# Patient Record
Sex: Female | Born: 1995 | Race: White | Hispanic: No | Marital: Single | Smoking: Former smoker
Health system: Southern US, Community
[De-identification: ages and names within clinical notes are randomized; demographics above are authoritative.]

## PROBLEM LIST (undated history)

## (undated) DIAGNOSIS — F419 Anxiety disorder, unspecified: Secondary | ICD-10-CM

## (undated) DIAGNOSIS — R519 Headache, unspecified: Secondary | ICD-10-CM

## (undated) DIAGNOSIS — F329 Major depressive disorder, single episode, unspecified: Secondary | ICD-10-CM

## (undated) DIAGNOSIS — F32A Depression, unspecified: Secondary | ICD-10-CM

## (undated) DIAGNOSIS — R51 Headache: Secondary | ICD-10-CM

## (undated) DIAGNOSIS — M546 Pain in thoracic spine: Secondary | ICD-10-CM

## (undated) HISTORY — DX: Pain in thoracic spine: M54.6

## (undated) HISTORY — DX: Anxiety disorder, unspecified: F41.9

## (undated) HISTORY — DX: Headache: R51

## (undated) HISTORY — DX: Depression, unspecified: F32.A

## (undated) HISTORY — DX: Major depressive disorder, single episode, unspecified: F32.9

## (undated) HISTORY — PX: NO PAST SURGERIES: SHX2092

## (undated) HISTORY — DX: Headache, unspecified: R51.9

---

## 2015-09-22 NOTE — L&D Delivery Note (Signed)
Patient is 20 y.o. G1P0 4864w3d admitted for IOL 2/2 PROM  Delivery Note At 1:39 AM a viable female was delivered via Vaginal, Spontaneous Delivery (Presentation: ROA ).No complications.   APGAR: 9, 9;  weight pending  Placenta status:intact , .  Cord: no nuchal   Anesthesia:   Episiotomy: None Lacerations:  1st degree laceration  Suture Repair: none Est. Blood Loss (mL):  200   Mom to postpartum.  Baby to Couplet care / Skin to Skin.  Miquan Tandon Z Amariyana Heacox 05/10/2016, 2:35 AM  Upon arrival patient was complete and pushing. She pushed with good maternal effort to deliver a healthy baby. Baby delivered without difficulty, was noted to have good tone and place on maternal abdomen for oral suctioning, drying and stimulation. Delayed cord clamping performed. Placenta delivered intact with 3V cord. Vaginal canal and perineum was inspected and there was a 1st degree laceration, hemostasis was applied and laceration stopped bleeding ; hemostatic. Pitocin was started and uterus massaged until bleeding slowed. Counts of sharps, instruments, and lap pads were all correct.

## 2015-11-05 ENCOUNTER — Encounter: Payer: Self-pay | Admitting: Obstetrics

## 2015-11-05 ENCOUNTER — Ambulatory Visit (INDEPENDENT_AMBULATORY_CARE_PROVIDER_SITE_OTHER): Payer: Medicaid Other | Admitting: Obstetrics

## 2015-11-05 VITALS — BP 108/70 | HR 94 | Temp 98.2°F | Ht 65.0 in | Wt 167.0 lb

## 2015-11-05 DIAGNOSIS — Z3401 Encounter for supervision of normal first pregnancy, first trimester: Secondary | ICD-10-CM | POA: Diagnosis not present

## 2015-11-05 LAB — POCT URINALYSIS DIPSTICK
BILIRUBIN UA: NEGATIVE
Blood, UA: NEGATIVE
Glucose, UA: NEGATIVE
Ketones, UA: NEGATIVE
NITRITE UA: NEGATIVE
PH UA: 6
PROTEIN UA: NEGATIVE
Spec Grav, UA: 1.015
Urobilinogen, UA: NEGATIVE

## 2015-11-05 MED ORDER — VITAFOL ULTRA 29-0.6-0.4-200 MG PO CAPS: 1.0000 | ORAL_CAPSULE | Freq: Every day | ORAL | Status: DC

## 2015-11-05 NOTE — Progress Notes (Signed)
Subjective:    Rachel Campos is being seen today for her first obstetrical visit.  This is not a planned pregnancy. She is at 109w5d gestation. Her obstetrical history is significant for none. Relationship with FOB: significant other, living together. Patient does intend to breast feed. Pregnancy history fully reviewed.  The information documented in the HPI was reviewed and verified.  Menstrual History: OB History    Gravida Para Term Preterm AB TAB SAB Ectopic Multiple Living   1               Menarche age: 53  Patient's last menstrual period was 08/15/2015.    Past Medical History  Diagnosis Date  . Medical history non-contributory     Past Surgical History  Procedure Laterality Date  . No past surgeries       (Not in a hospital admission) No Known Allergies  Social History  Substance Use Topics  . Smoking status: Former Games developer  . Smokeless tobacco: Never Used  . Alcohol Use: No    Family History  Problem Relation Age of Onset  . Adopted: Yes  . Family history unknown: Yes     Review of Systems Constitutional: negative for weight loss Gastrointestinal: negative for vomiting Genitourinary:negative for genital lesions and vaginal discharge and dysuria Musculoskeletal:negative for back pain Behavioral/Psych: negative for abusive relationship, depression, illegal drug usage and tobacco use    Objective:    BP 108/70 mmHg  Pulse 94  Temp(Src) 98.2 F (36.8 C)  Ht  (1.651 m)  Wt 167 lb (75.751 kg)  BMI 27.79 kg/m2  LMP 08/15/2015 General Appearance:    Alert, cooperative, no distress, appears stated age  Head:    Normocephalic, without obvious abnormality, atraumatic  Eyes:    PERRL, conjunctiva/corneas clear, EOM's intact, fundi    benign, both eyes  Ears:    Normal TM's and external ear canals, both ears  Nose:   Nares normal, septum midline, mucosa normal, no drainage    or sinus tenderness  Throat:   Lips, mucosa, and tongue normal; teeth and gums  normal  Neck:   Supple, symmetrical, trachea midline, no adenopathy;    thyroid:  no enlargement/tenderness/nodules; no carotid   bruit or JVD  Back:     Symmetric, no curvature, ROM normal, no CVA tenderness  Lungs:     Clear to auscultation bilaterally, respirations unlabored  Chest Wall:    No tenderness or deformity   Heart:    Regular rate and rhythm, S1 and S2 normal, no murmur, rub   or gallop  Breast Exam:    No tenderness, masses, or nipple abnormality  Abdomen:     Soft, non-tender, bowel sounds active all four quadrants,    no masses, no organomegaly  Genitalia:    Normal female without lesion, discharge or tenderness  Extremities:   Extremities normal, atraumatic, no cyanosis or edema  Pulses:   2+ and symmetric all extremities  Skin:   Skin color, texture, turgor normal, no rashes or lesions  Lymph nodes:   Cervical, supraclavicular, and axillary nodes normal  Neurologic:   CNII-XII intact, normal strength, sensation and reflexes    throughout      Lab Review Urine pregnancy test Labs reviewed no Radiologic studies reviewed yes Assessment:    Pregnancy at [redacted]w[redacted]d weeks    Plan:      Prenatal vitamins.  Counseling provided regarding continued use of seat belts, cessation of alcohol consumption, smoking or use of illicit drugs; infection  precautions i.e., influenza/TDAP immunizations, toxoplasmosis,CMV, parvovirus, listeria and varicella; workplace safety, exercise during pregnancy; routine dental care, safe medications, sexual activity, hot tubs, saunas, pools, travel, caffeine use, fish and methlymercury, potential toxins, hair treatments, varicose veins Weight gain recommendations per IOM guidelines reviewed: underweight/BMI< 18.5--> gain 28 - 40 lbs; normal weight/BMI 18.5 - 24.9--> gain 25 - 35 lbs; overweight/BMI 25 - 29.9--> gain 15 - 25 lbs; obese/BMI >30->gain  11 - 20 lbs Problem list reviewed and updated. FIRST/CF mutation testing/NIPT/QUAD SCREEN/fragile  X/Ashkenazi Jewish population testing/Spinal muscular atrophy discussed: requested. Role of ultrasound in pregnancy discussed; fetal survey: requested. Amniocentesis discussed: not indicated. VBAC calculator score: VBAC consent form provided Meds ordered this encounter  Medications  . Prenatal Vit-Fe Fumarate-FA (PRENATAL VITAMINS PLUS PO)    Sig: Take by mouth.   Orders Placed This Encounter  Procedures  . Culture, OB Urine  . SureSwab, Vaginosis/Vaginitis Plus  . Obstetric panel  . HIV antibody  . Varicella zoster antibody, IgG  . VITAMIN D 25 Hydroxy (Vit-D Deficiency, Fractures)  . POCT urinalysis dipstick    Follow up in 4 weeks.

## 2015-11-06 LAB — OBSTETRIC PANEL
Antibody Screen: NEGATIVE
Basophils Absolute: 0 10*3/uL (ref 0.0–0.1)
Basophils Relative: 0 % (ref 0–1)
EOS PCT: 1 % (ref 0–5)
Eosinophils Absolute: 0.1 10*3/uL (ref 0.0–0.7)
HCT: 38.9 % (ref 36.0–46.0)
HEP B S AG: NEGATIVE
Hemoglobin: 12.9 g/dL (ref 12.0–15.0)
Lymphocytes Relative: 14 % (ref 12–46)
Lymphs Abs: 1.3 10*3/uL (ref 0.7–4.0)
MCH: 30.6 pg (ref 26.0–34.0)
MCHC: 33.2 g/dL (ref 30.0–36.0)
MCV: 92.4 fL (ref 78.0–100.0)
MPV: 9.2 fL (ref 8.6–12.4)
Monocytes Absolute: 0.7 10*3/uL (ref 0.1–1.0)
Monocytes Relative: 8 % (ref 3–12)
NEUTROS PCT: 77 % (ref 43–77)
Neutro Abs: 7 10*3/uL (ref 1.7–7.7)
PLATELETS: 279 10*3/uL (ref 150–400)
RBC: 4.21 MIL/uL (ref 3.87–5.11)
RDW: 13.1 % (ref 11.5–15.5)
RH TYPE: NEGATIVE
Rubella: 11.5 Index — ABNORMAL HIGH (ref <0.90)
WBC: 9.1 10*3/uL (ref 4.0–10.5)

## 2015-11-06 LAB — VARICELLA ZOSTER ANTIBODY, IGG: Varicella IgG: 327.3 Index — ABNORMAL HIGH (ref <135.00)

## 2015-11-06 LAB — HIV ANTIBODY (ROUTINE TESTING W REFLEX): HIV 1&2 Ab, 4th Generation: NONREACTIVE

## 2015-11-06 LAB — CULTURE, OB URINE

## 2015-11-06 LAB — VITAMIN D 25 HYDROXY (VIT D DEFICIENCY, FRACTURES): VIT D 25 HYDROXY: 24 ng/mL — AB (ref 30–100)

## 2015-11-09 LAB — SURESWAB, VAGINOSIS/VAGINITIS PLUS
Atopobium vaginae: 7.6 Log (cells/mL)
C. ALBICANS, DNA: DETECTED — AB
C. GLABRATA, DNA: NOT DETECTED
C. TRACHOMATIS RNA, TMA: NOT DETECTED
C. TROPICALIS, DNA: NOT DETECTED
C. parapsilosis, DNA: NOT DETECTED
LACTOBACILLUS SPECIES: NOT DETECTED Log (cells/mL)
MEGASPHAERA SPECIES: >8 Log (cells/mL)
N. gonorrhoeae RNA, TMA: NOT DETECTED
T. VAGINALIS RNA, QL TMA: NOT DETECTED

## 2015-11-10 ENCOUNTER — Other Ambulatory Visit: Payer: Self-pay | Admitting: Obstetrics

## 2015-11-10 DIAGNOSIS — B373 Candidiasis of vulva and vagina: Secondary | ICD-10-CM

## 2015-11-10 DIAGNOSIS — B3731 Acute candidiasis of vulva and vagina: Secondary | ICD-10-CM

## 2015-11-10 DIAGNOSIS — B9689 Other specified bacterial agents as the cause of diseases classified elsewhere: Secondary | ICD-10-CM

## 2015-11-10 DIAGNOSIS — N760 Acute vaginitis: Principal | ICD-10-CM

## 2015-11-10 MED ORDER — TERCONAZOLE 0.4 % VA CREA: 1.0000 | TOPICAL_CREAM | Freq: Every day | VAGINAL | Status: DC

## 2015-11-10 MED ORDER — TINIDAZOLE 500 MG PO TABS: 1000.0000 mg | ORAL_TABLET | Freq: Every day | ORAL | Status: DC

## 2015-12-03 ENCOUNTER — Ambulatory Visit (INDEPENDENT_AMBULATORY_CARE_PROVIDER_SITE_OTHER): Payer: Medicaid Other | Admitting: Certified Nurse Midwife

## 2015-12-03 VITALS — BP 122/73 | HR 70 | Temp 98.2°F | Wt 173.0 lb

## 2015-12-03 DIAGNOSIS — O26892 Other specified pregnancy related conditions, second trimester: Secondary | ICD-10-CM

## 2015-12-03 DIAGNOSIS — R519 Headache, unspecified: Secondary | ICD-10-CM

## 2015-12-03 DIAGNOSIS — R51 Headache: Secondary | ICD-10-CM

## 2015-12-03 DIAGNOSIS — Z3402 Encounter for supervision of normal first pregnancy, second trimester: Secondary | ICD-10-CM

## 2015-12-03 LAB — POCT URINALYSIS DIPSTICK
BILIRUBIN UA: NEGATIVE
Blood, UA: NEGATIVE
Glucose, UA: NEGATIVE
KETONES UA: NEGATIVE
LEUKOCYTES UA: NEGATIVE
NITRITE UA: NEGATIVE
PH UA: 5
Protein, UA: NEGATIVE
Spec Grav, UA: 1.01
Urobilinogen, UA: NEGATIVE

## 2015-12-03 MED ORDER — BUTALBITAL-APAP-CAFFEINE 50-325-40 MG PO TABS: 1.0000 | ORAL_TABLET | Freq: Four times a day (QID) | ORAL | Status: DC | PRN

## 2015-12-03 NOTE — Progress Notes (Signed)
  Subjective:    Eben BurowOlga Akel is a 20 y.o. female being seen today for her obstetrical visit. She is at 7961w5d gestation. Patient reports: headache, no bleeding, no contractions, no cramping and no leaking.    Problem List Items Addressed This Visit    None    Visit Diagnoses    Encounter for supervision of normal first pregnancy in second trimester    -  Primary    Relevant Orders    POCT urinalysis dipstick    AFP, Quad Screen    US OB Comp + 14 Wk    Headache in pregnancy, antepartum, second trimester        Relevant Medications    butalbital-acetaminophen-caffeine (FIORICET) 50-325-40 MG tablet      There are no active problems to display for this patient.   Objective:     BP 122/73 mmHg  Pulse 70  Temp(Src) 98.2 F (36.8 C)  Wt 173 lb (78.472 kg)  LMP 08/15/2015 Uterine Size: Below umbilicus   FHR by doppler: 158  Assessment:    Pregnancy @ 5661w5d  weeks Doing well   HA in pregnancy  Plan:    Problem list reviewed and updated. Labs reviewed.  Follow up in 4 weeks. FIRST/CF mutation testing/NIPT/QUAD SCREEN/fragile X/Ashkenazi Jewish population testing/Spinal muscular atrophy discussed: ordered. Role of ultrasound in pregnancy discussed; fetal survey: ordered. Amniocentesis discussed: not indicated. 50% of 15 minute visit spent on counseling and coordination of care.

## 2015-12-11 LAB — AFP, QUAD SCREEN
DIA Mom Value: 1.14
DIA VALUE (EIA): 194.82 pg/mL
DSR (BY AGE) 1 IN: 1148
DSR (SECOND TRIMESTER) 1 IN: 2369
GESTATIONAL AGE AFP: 15.7 wk
MSAFP Mom: 0.82
MSAFP: 23.4 ng/mL
MSHCG MOM: 1.05
MSHCG: 42515 m[IU]/mL
Maternal Age At EDD: 21 YEARS
Osb Risk: 10000
PDF: 0
TEST RESULTS AFP: NEGATIVE
WEIGHT: 173 [lb_av]
uE3 Mom: 0.78
uE3 Value: 0.56 ng/mL

## 2016-01-02 ENCOUNTER — Other Ambulatory Visit: Payer: Medicaid Other

## 2016-01-02 ENCOUNTER — Encounter: Payer: Medicaid Other | Admitting: Certified Nurse Midwife

## 2016-01-07 ENCOUNTER — Other Ambulatory Visit: Payer: Self-pay | Admitting: Certified Nurse Midwife

## 2016-01-16 ENCOUNTER — Ambulatory Visit (INDEPENDENT_AMBULATORY_CARE_PROVIDER_SITE_OTHER): Payer: Medicaid Other | Admitting: Certified Nurse Midwife

## 2016-01-16 ENCOUNTER — Ambulatory Visit (INDEPENDENT_AMBULATORY_CARE_PROVIDER_SITE_OTHER): Payer: Medicaid Other

## 2016-01-16 VITALS — BP 113/67 | HR 72 | Temp 98.6°F | Wt 183.0 lb

## 2016-01-16 DIAGNOSIS — F329 Major depressive disorder, single episode, unspecified: Secondary | ICD-10-CM

## 2016-01-16 DIAGNOSIS — O99342 Other mental disorders complicating pregnancy, second trimester: Secondary | ICD-10-CM

## 2016-01-16 DIAGNOSIS — Z3402 Encounter for supervision of normal first pregnancy, second trimester: Secondary | ICD-10-CM

## 2016-01-16 DIAGNOSIS — F32A Depression, unspecified: Secondary | ICD-10-CM

## 2016-01-16 DIAGNOSIS — Z36 Encounter for antenatal screening of mother: Secondary | ICD-10-CM

## 2016-01-16 MED ORDER — SERTRALINE HCL 50 MG PO TABS: 50.0000 mg | ORAL_TABLET | Freq: Every day | ORAL | Status: DC

## 2016-01-16 NOTE — Progress Notes (Signed)
Subjective:    Rachel Campos is a 20 y.o. female being seen today for her obstetrical visit. She is at 419w0d gestation. Patient reports: no bleeding, no contractions, no cramping, no leaking and depression symptoms: "does not feel like herself", denies homicidal/suicidal ideations, desires to try antidepressants to help her during this time, struggled with depression before pregnancy was not ever on any medicaitons . Fetal movement: normal.  Problem List Items Addressed This Visit    None     There are no active problems to display for this patient.  Objective:    LMP 08/15/2015 FHT: 145 BPM  Uterine Size: size equals dates     Assessment:    Pregnancy @ 109w0d    Depression Plan:    OBGCT: discussed. Signs and symptoms of preterm labor: discussed.  Labs, problem list reviewed and updated 2 hr GTT planned Follow up in 4 weeks.

## 2016-01-16 NOTE — Progress Notes (Signed)
Patient has concerns or questions today. Patient notices significant swelling in feet every other day

## 2016-02-13 ENCOUNTER — Other Ambulatory Visit: Payer: Medicaid Other

## 2016-02-13 ENCOUNTER — Ambulatory Visit (INDEPENDENT_AMBULATORY_CARE_PROVIDER_SITE_OTHER): Payer: Medicaid Other | Admitting: Certified Nurse Midwife

## 2016-02-13 VITALS — BP 118/77 | HR 76 | Wt 196.0 lb

## 2016-02-13 DIAGNOSIS — Z3402 Encounter for supervision of normal first pregnancy, second trimester: Secondary | ICD-10-CM

## 2016-02-13 LAB — POCT URINALYSIS DIPSTICK
BILIRUBIN UA: NEGATIVE
Glucose, UA: 50
Ketones, UA: NEGATIVE
Leukocytes, UA: NEGATIVE
NITRITE UA: NEGATIVE
PH UA: 7
Protein, UA: NEGATIVE
RBC UA: NEGATIVE
Spec Grav, UA: <=1.005
Urobilinogen, UA: NEGATIVE

## 2016-02-13 NOTE — Progress Notes (Signed)
Subjective:    Rachel BurowOlga Campos is a 20 y.o. female being seen today for her obstetrical visit. She is at 6729w0d gestation. Patient reports: no bleeding, no contractions, no cramping, no leaking and pelvic pain . Fetal movement: normal.  Problem List Items Addressed This Visit    None    Visit Diagnoses    Encounter for supervision of normal first pregnancy in second trimester    -  Primary    Relevant Orders    POCT urinalysis dipstick      There are no active problems to display for this patient.  Objective:    BP 118/77 mmHg  Pulse 76  Wt 196 lb (88.905 kg)  LMP 08/15/2015 FHT: 150 BPM  Uterine Size: size equals dates     Assessment:    Pregnancy @ 6629w0d    RH-  Pelvic pain of pregnancy  Plan:   Rx: abdominal maternity support belt  OBGCT: discussed and ordered. Antibody screen: Rhogam discussed. Signs and symptoms of preterm labor: discussed.  Labs, problem list reviewed and updated 2 hr GTT today Follow up in 2 weeks.

## 2016-02-13 NOTE — Progress Notes (Signed)
Pt has had some lower pelvic pressure.

## 2016-02-14 LAB — RPR: RPR: NONREACTIVE

## 2016-02-14 LAB — CBC
Hematocrit: 33.4 % — ABNORMAL LOW (ref 34.0–46.6)
Hemoglobin: 11.2 g/dL (ref 11.1–15.9)
MCH: 30.9 pg (ref 26.6–33.0)
MCHC: 33.5 g/dL (ref 31.5–35.7)
MCV: 92 fL (ref 79–97)
PLATELETS: 250 10*3/uL (ref 150–379)
RBC: 3.63 x10E6/uL — ABNORMAL LOW (ref 3.77–5.28)
RDW: 13.1 % (ref 12.3–15.4)
WBC: 12.2 10*3/uL — AB (ref 3.4–10.8)

## 2016-02-14 LAB — GLUCOSE TOLERANCE, 2 HOURS W/ 1HR
GLUCOSE, FASTING: 85 mg/dL (ref 65–91)
Glucose, 1 hour: 121 mg/dL (ref 65–179)
Glucose, 2 hour: 75 mg/dL (ref 65–152)

## 2016-02-14 LAB — HIV ANTIBODY (ROUTINE TESTING W REFLEX): HIV SCREEN 4TH GENERATION: NONREACTIVE

## 2016-02-26 ENCOUNTER — Encounter (HOSPITAL_COMMUNITY): Payer: Self-pay | Admitting: *Deleted

## 2016-02-26 ENCOUNTER — Inpatient Hospital Stay (HOSPITAL_COMMUNITY)
Admission: AD | Admit: 2016-02-26 | Discharge: 2016-02-26 | Disposition: A | Discharge status: Home / Self Care | Payer: Medicaid Other | Source: Ambulatory Visit | Attending: Obstetrics | Admitting: Obstetrics

## 2016-02-26 DIAGNOSIS — Z87891 Personal history of nicotine dependence: Secondary | ICD-10-CM | POA: Diagnosis not present

## 2016-02-26 DIAGNOSIS — R109 Unspecified abdominal pain: Secondary | ICD-10-CM | POA: Diagnosis present

## 2016-02-26 DIAGNOSIS — M549 Dorsalgia, unspecified: Secondary | ICD-10-CM | POA: Diagnosis present

## 2016-02-26 DIAGNOSIS — O26892 Other specified pregnancy related conditions, second trimester: Secondary | ICD-10-CM | POA: Insufficient documentation

## 2016-02-26 DIAGNOSIS — O99891 Other specified diseases and conditions complicating pregnancy: Secondary | ICD-10-CM

## 2016-02-26 DIAGNOSIS — O9989 Other specified diseases and conditions complicating pregnancy, childbirth and the puerperium: Secondary | ICD-10-CM

## 2016-02-26 DIAGNOSIS — Z3A27 27 weeks gestation of pregnancy: Secondary | ICD-10-CM | POA: Diagnosis not present

## 2016-02-26 LAB — URINALYSIS, ROUTINE W REFLEX MICROSCOPIC
Bilirubin Urine: NEGATIVE
Glucose, UA: NEGATIVE mg/dL
KETONES UR: NEGATIVE mg/dL
NITRITE: NEGATIVE
PH: 6 (ref 5.0–8.0)
Protein, ur: NEGATIVE mg/dL
Specific Gravity, Urine: <1.005 — ABNORMAL LOW (ref 1.005–1.030)

## 2016-02-26 LAB — URINE MICROSCOPIC-ADD ON

## 2016-02-26 LAB — FETAL FIBRONECTIN: Fetal Fibronectin: NEGATIVE

## 2016-02-26 MED ORDER — CYCLOBENZAPRINE HCL 5 MG PO TABS: 5.0000 mg | ORAL_TABLET | Freq: Three times a day (TID) | ORAL | Status: DC | PRN

## 2016-02-26 MED ORDER — CYCLOBENZAPRINE HCL 5 MG PO TABS: 10.0000 mg | ORAL_TABLET | Freq: Three times a day (TID) | ORAL | Status: DC | PRN

## 2016-02-26 MED ORDER — CYCLOBENZAPRINE HCL 10 MG PO TABS
10.0000 mg | ORAL_TABLET | Freq: Once | ORAL | Status: AC
  Administered 2016-02-26: 10 mg via ORAL
  Filled 2016-02-26: qty 1

## 2016-02-26 NOTE — Discharge Instructions (Signed)
Preterm Labor Information Preterm labor is when labor starts before you are [redacted] weeks pregnant. The normal length of pregnancy is 39 to 41 weeks.  CAUSES  The cause of preterm labor is not often known. The most common known cause is infection. RISK FACTORS  Having a history of preterm labor.  Having your water break before it should.  Having a placenta that covers the opening of the cervix.  Having a placenta that breaks away from the uterus.  Having a cervix that is too weak to hold the baby in the uterus.  Having too much fluid in the amniotic sac.  Taking drugs or smoking while pregnant.  Not gaining enough weight while pregnant.  Being younger than 4318 and older than 21 years old.  Having a low income.  Being African American. SYMPTOMS  Period-like cramps, belly (abdominal) pain, or back pain.  Contractions that are regular, as often as six in an hour. They may be mild or painful.  Contractions that start at the top of the belly. They then move to the lower belly and back.  Lower belly pressure that seems to get stronger.  Bleeding from the vagina.  Fluid leaking from the vagina. TREATMENT  Treatment depends on:  Your condition.  The condition of your baby.  How many weeks pregnant you are. Your doctor may have you:  Take medicine to stop contractions.  Stay in bed except to use the restroom (bed rest).  Stay in the hospital. WHAT SHOULD YOU DO IF YOU THINK YOU ARE IN PRETERM LABOR? Call your doctor right away. You need to go to the hospital right away.  HOW CAN YOU PREVENT PRETERM LABOR IN FUTURE PREGNANCIES?  Stop smoking, if you smoke.  Maintain healthy weight gain.  Do not take drugs or be around chemicals that are not needed.  Tell your doctor if you think you have an infection.  Tell your doctor if you had a preterm labor before.   This information is not intended to replace advice given to you by your health care provider. Make sure you  discuss any questions you have with your health care provider.   Document Released: 12/04/2008 Document Revised: 01/22/2015 Document Reviewed: 10/10/2012 Elsevier Interactive Patient Education 2016 Elsevier Inc[employer] Back Pain in Pregnancy Back pain during pregnancy is common. It happens in about half of all pregnancies. It is important for you and your baby that you remain active during your pregnancy.If you feel that back pain is not allowing you to remain active or sleep well, it is time to see your caregiver. Back pain may be caused by several factors related to changes during your pregnancy.Fortunately, unless you had trouble with your back before your pregnancy, the pain is likely to get better after you deliver. Low back pain usually occurs between the fifth and seventh months of pregnancy. It can, however, happen in the first couple months. Factors that increase the risk of back problems include:   Previous back problems.  Injury to your back.  Having twins or multiple births.  A chronic cough.  Stress.  Job-related repetitive motions.  Muscle or spinal disease in the back.  Family history of back problems, ruptured (herniated) discs, or osteoporosis.  Depression, anxiety, and panic attacks. CAUSES   When you are pregnant, your body produces a hormone called relaxin. This hormonemakes the ligaments connecting the low back and pubic bones more flexible. This flexibility allows the baby to be delivered more easily. When your ligaments are loose,  your muscles need to work harder to support your back. Soreness in your back can come from tired muscles. Soreness can also come from back tissues that are irritated since they are receiving less support.  As the baby grows, it puts pressure on the nerves and blood vessels in your pelvis. This can cause back pain.  As the baby grows and gets heavier during pregnancy, the uterus pushes the stomach muscles forward and changes your center of  gravity. This makes your back muscles work harder to maintain good posture. SYMPTOMS  Lumbar pain during pregnancy Lumbar pain during pregnancy usually occurs at or above the waist in the center of the back. There may be pain and numbness that radiates into your leg or foot. This is similar to low back pain experienced by non-pregnant women. It usually increases with sitting for long periods of time, standing, or repetitive lifting. Tenderness may also be present in the muscles along your upper back. Posterior pelvic pain during pregnancy Pain in the back of the pelvis is more common than lumbar pain in pregnancy. It is a deep pain felt in your side at the waistline, or across the tailbone (sacrum), or in both places. You may have pain on one or both sides. This pain can also go into the buttocks and backs of the upper thighs. Pubic and groin pain may also be present. The pain does not quickly resolve with rest, and morning stiffness may also be present. Pelvic pain during pregnancy can be brought on by most activities. A high level of fitness before and during pregnancy may or may not prevent this problem. Labor pain is usually 1 to 2 minutes apart, lasts for about 1 minute, and involves a bearing down feeling or pressure in your pelvis. However, if you are at term with the pregnancy, constant low back pain can be the beginning of early labor, and you should be aware of this. DIAGNOSIS  X-rays of the back should not be done during the first 12 to 14 weeks of the pregnancy and only when absolutely necessary during the rest of the pregnancy. MRIs do not give off radiation and are safe during pregnancy. MRIs also should only be done when absolutely necessary. HOME CARE INSTRUCTIONS  Exercise as directed by your caregiver. Exercise is the most effective way to prevent or manage back pain. If you have a back problem, it is especially important to avoid sports that require sudden body movements. Swimming and  walking are great activities.  Do not stand in one place for long periods of time.  Do not wear high heels.  Sit in chairs with good posture. Use a pillow on your lower back if necessary. Make sure your head rests over your shoulders and is not hanging forward.  Try sleeping on your side, preferably the left side, with a pillow or two between your legs. If you are sore after a night's rest, your bedmay betoo soft.Try placing a board between your mattress and box spring.  Listen to your body when lifting.If you are experiencing pain, ask for help or try bending yourknees more so you can use your leg muscles rather than your back muscles. Squat down when picking up something from the floor. Do not bend over.  Eat a healthy diet. Try to gain weight within your caregiver's recommendations.  Use heat or cold packs 3 to 4 times a day for 15 minutes to help with the pain.  Only take over-the-counter or prescription medicines for  pain, discomfort, or fever as directed by your caregiver. Sudden (acute) back pain  Use bed rest for only the most extreme, acute episodes of back pain. Prolonged bed rest over 48 hours will aggravate your condition.  Ice is very effective for acute conditions.  Put ice in a plastic bag.  Place a towel between your skin and the bag.  Leave the ice on for 10 to 20 minutes every 2 hours, or as needed.  Using heat packs for 30 minutes prior to activities is also helpful. Continued back pain See your caregiver if you have continued problems. Your caregiver can help or refer you for appropriate physical therapy. With conditioning, most back problems can be avoided. Sometimes, a more serious issue may be the cause of back pain. You should be seen right away if new problems seem to be developing. Your caregiver may recommend:  A maternity girdle.  An elastic sling.  A back brace.  A massage therapist or acupuncture. SEEK MEDICAL CARE IF:   You are not able to  do most of your daily activities, even when taking the pain medicine you were given.  You need a referral to a physical therapist or chiropractor.  You want to try acupuncture. SEEK IMMEDIATE MEDICAL CARE IF:  You develop numbness, tingling, weakness, or problems with the use of your arms or legs.  You develop severe back pain that is no longer relieved with medicines.  You have a sudden change in bowel or bladder control.  You have increasing pain in other areas of the body.  You develop shortness of breath, dizziness, or fainting.  You develop nausea, vomiting, or sweating.  You have back pain which is similar to labor pains.  You have back pain along with your water breaking or vaginal bleeding.  You have back pain or numbness that travels down your leg.  Your back pain developed after you fell.  You develop pain on one side of your back. You may have a kidney stone.  You see blood in your urine. You may have a bladder infection or kidney stone.  You have back pain with blisters. You may have shingles. Back pain is fairly common during pregnancy but should not be accepted as just part of the process. Back pain should always be treated as soon as possible. This will make your pregnancy as pleasant as possible.   This information is not intended to replace advice given to you by your health care provider. Make sure you discuss any questions you have with your health care provider.   Document Released: 12/16/2005 Document Revised: 11/30/2011 Document Reviewed: 01/27/2011 Elsevier Interactive Patient Education [employer]

## 2016-02-26 NOTE — MAU Provider Note (Signed)
History     CSN: [id number]  Arrival date and time: 02/26/16 1512    First Provider Initiated Contact with Patient 02/26/16 1622      Chief Complaint  Patient presents with  . Abdominal Pain  . Back Pain   HPI Rachel Campos is a 20 y.o. G1P0 at 3142w6d who presents with abdominal & back pain. Symptoms began this morning. Reports intermittent pain in upper abdomen that wraps around to her mid back and lower abdomen. Occurs every 20 minutes. Rates pain 8/10. Has not treated. Denies LOF or vaginal bleeding. Positive fetal movement.   OB History    Gravida Para Term Preterm AB TAB SAB Ectopic Multiple Living   1               Past Medical History  Diagnosis Date  . Medical history non-contributory     Past Surgical History  Procedure Laterality Date  . No past surgeries      Family History  Problem Relation Age of Onset  . Adopted: Yes  . Family history unknown: Yes    Social History  Substance Use Topics  . Smoking status: Former Games developermoker  . Smokeless tobacco: Never Used  . Alcohol Use: No    Allergies: No Known Allergies  Prescriptions prior to admission  Medication Sig Dispense Refill Last Dose  . calcium carbonate (TUMS - DOSED IN MG ELEMENTAL CALCIUM) 500 MG chewable tablet Chew 1 tablet by mouth 3 (three) times daily as needed for indigestion or heartburn.   02/25/2016 at Unknown time  . Prenat-Fe Poly-Methfol-FA-DHA (VITAFOL ULTRA) 29-0.6-0.4-200 MG CAPS Take 1 capsule by mouth daily before breakfast. 90 capsule 3 02/25/2016 at Unknown time  . butalbital-acetaminophen-caffeine (FIORICET) 50-325-40 MG tablet Take 1-2 tablets by mouth every 6 (six) hours as needed for headache. 45 tablet 4 PRN  . sertraline (ZOLOFT) 50 MG tablet Take 1 tablet (50 mg total) by mouth daily. (Patient not taking: Reported on 02/26/2016) 30 tablet 2 Not Taking at Unknown time  . terconazole (TERAZOL 7) 0.4 % vaginal cream Place 1 applicator vaginally at bedtime. (Patient not taking: Reported on  02/26/2016) 45 g 0 Completed Course at Unknown time    Review of Systems  Constitutional: Negative.   Gastrointestinal: Positive for abdominal pain. Negative for nausea, vomiting, diarrhea and constipation.  Genitourinary: Negative.   Musculoskeletal: Positive for back pain.   Physical Exam   Blood pressure 127/67, pulse 77, resp. rate 16, last menstrual period 08/15/2015.  Physical Exam  Nursing note and vitals reviewed. Constitutional: She is oriented to person, place, and time. She appears well-developed and well-nourished. No distress.  HENT:  Head: Normocephalic and atraumatic.  Eyes: Conjunctivae are normal. Right eye exhibits no discharge. Left eye exhibits no discharge. No scleral icterus.  Neck: Normal range of motion.  Cardiovascular: Normal rate, regular rhythm and normal heart sounds.   No murmur heard. Respiratory: Effort normal and breath sounds normal. No respiratory distress. She has no wheezes.  GI: Soft. There is no tenderness. There is no CVA tenderness.  Neurological: She is alert and oriented to person, place, and time.  Skin: Skin is warm and dry. She is not diaphoretic.  Psychiatric: She has a normal mood and affect. Her behavior is normal. Judgment and thought content normal.   Dilation: Closed Effacement (%): Thick Exam by:: E Kitara Hebb NP  Fetal Tracing:  Baseline: 135 Variability: moderate Accelerations: 15x15 Decelerations: none  Toco: none    MAU Course  Procedures Results for orders  placed or performed during the hospital encounter of 02/26/16 (from the past 24 hour(s))  Urinalysis, Routine w reflex microscopic (not at [facility])     Status: Abnormal   Collection Time: 02/26/16  4:04 PM  Result Value Ref Range   Color, Urine YELLOW YELLOW   APPearance HAZY (A) CLEAR   Specific Gravity, Urine <1.005 (L) 1.005 - 1.030   pH 6.0 5.0 - 8.0   Glucose, UA NEGATIVE NEGATIVE mg/dL   Hgb urine dipstick TRACE (A) NEGATIVE   Bilirubin Urine NEGATIVE  NEGATIVE   Ketones, ur NEGATIVE NEGATIVE mg/dL   Protein, ur NEGATIVE NEGATIVE mg/dL   Nitrite NEGATIVE NEGATIVE   Leukocytes, UA LARGE (A) NEGATIVE  Urine microscopic-add on     Status: Abnormal   Collection Time: 02/26/16  4:04 PM  Result Value Ref Range   Squamous Epithelial / LPF 0-5 (A) NONE SEEN   WBC, UA 6-30 0 - 5 WBC/hpf   RBC / HPF 0-5 0 - 5 RBC/hpf   Bacteria, UA FEW (A) NONE SEEN  Fetal fibronectin     Status: None   Collection Time: 02/26/16  4:30 PM  Result Value Ref Range   Fetal Fibronectin NEGATIVE NEGATIVE    MDM Reactive tracing, no ctx on monitor FFN negative Flexeril 10 mg PO -- pt reports improvement in pain from 8>3 S/w Dr. Clearance Coots -- ok to discharge home. Will send home with some flexeril. Pt has f/u with [facility] tomorrow.  Assessment and Plan  A: 1. Back pain affecting pregnancy in second trimester     P: Discharge home Rx small amt of flexeril Keep f/u with [facility] tomorrow Discussed reasons to return to MAU  Judeth Horn 02/26/2016, 4:21 PM

## 2016-02-26 NOTE — MAU Note (Signed)
C/o rib pain today at work and pt's employee sent her to MAU; c/o back pain "for a while";

## 2016-02-27 ENCOUNTER — Ambulatory Visit (INDEPENDENT_AMBULATORY_CARE_PROVIDER_SITE_OTHER): Payer: Medicaid Other | Admitting: Certified Nurse Midwife

## 2016-02-27 VITALS — BP 113/75 | HR 97 | Wt 196.0 lb

## 2016-02-27 DIAGNOSIS — O360130 Maternal care for anti-D [Rh] antibodies, third trimester, not applicable or unspecified: Secondary | ICD-10-CM

## 2016-02-27 DIAGNOSIS — Z3483 Encounter for supervision of other normal pregnancy, third trimester: Secondary | ICD-10-CM

## 2016-02-27 LAB — POCT URINALYSIS DIPSTICK
Bilirubin, UA: NEGATIVE
Glucose, UA: NEGATIVE
Ketones, UA: NEGATIVE
LEUKOCYTES UA: NEGATIVE
NITRITE UA: NEGATIVE
PH UA: 5
Spec Grav, UA: 1.025
UROBILINOGEN UA: NEGATIVE

## 2016-02-27 MED ORDER — RHO D IMMUNE GLOBULIN 1500 UNIT/2ML IJ SOSY
300.0000 ug | PREFILLED_SYRINGE | Freq: Once | INTRAMUSCULAR | Status: AC
  Administered 2016-02-27: 300 ug via INTRAMUSCULAR

## 2016-02-29 LAB — URINE CULTURE, OB REFLEX: ORGANISM ID, BACTERIA: NO GROWTH

## 2016-02-29 LAB — CULTURE, OB URINE

## 2016-02-29 NOTE — Progress Notes (Signed)
Subjective:    Rachel BurowOlga Campos is a 20 y.o. female being seen today for her obstetrical visit. She is at 1176w0d gestation. Patient reports no complaints. Fetal movement: normal.  Problem List Items Addressed This Visit    None    Visit Diagnoses    Encounter for supervision of other normal pregnancy in third trimester    -  Primary    Relevant Medications    rho (d) immune globulin (RHIG/RHOPHYLAC) injection 300 mcg (Completed)    Other Relevant Orders    POCT urinalysis dipstick (Completed)    Culture, OB Urine (Completed)    Rh negative state in antepartum period, third trimester, not applicable or unspecified fetus        Relevant Medications    rho (d) immune globulin (RHIG/RHOPHYLAC) injection 300 mcg (Completed)      There are no active problems to display for this patient.  Objective:    BP 113/75 mmHg  Pulse 97  Wt 196 lb (88.905 kg)  LMP 08/15/2015 FHT:  145 BPM  Uterine Size: size equals dates  Presentation: unsure     Assessment:    Pregnancy @ 3967w2d weeks   RH- status: Rhogam given  Plan:   2 hour OGTT today   labs reviewed, problem list updated Consent signed. GBS planning TDAP offered  Rhogam given for RH negative Pediatrician: discussed. Infant feeding: plans to breastfeed. Maternity leave: discussed. Cigarette smoking: never smoked. Orders Placed This Encounter  Procedures  . Culture, OB Urine  . Result  . POCT urinalysis dipstick   Meds ordered this encounter  Medications  . rho (d) immune globulin (RHIG/RHOPHYLAC) injection 300 mcg    Sig:    Follow up in 2 Weeks.

## 2016-03-13 ENCOUNTER — Ambulatory Visit (INDEPENDENT_AMBULATORY_CARE_PROVIDER_SITE_OTHER): Payer: Medicaid Other | Admitting: Certified Nurse Midwife

## 2016-03-13 VITALS — BP 132/77 | HR 73 | Temp 98.4°F | Wt 211.0 lb

## 2016-03-13 DIAGNOSIS — Z3403 Encounter for supervision of normal first pregnancy, third trimester: Secondary | ICD-10-CM

## 2016-03-13 LAB — POCT URINALYSIS DIPSTICK
BILIRUBIN UA: NEGATIVE
Blood, UA: NEGATIVE
Glucose, UA: NEGATIVE
KETONES UA: NEGATIVE
Nitrite, UA: NEGATIVE
Spec Grav, UA: <=1.005
Urobilinogen, UA: NEGATIVE
pH, UA: 6

## 2016-03-13 NOTE — Progress Notes (Signed)
Subjective:    Rachel BurowOlga Campos is a 20 y.o. female being seen today for her obstetrical visit. She is at 5255w1d gestation. Patient reports no complaints. Fetal movement: normal.  Problem List Items Addressed This Visit    None    Visit Diagnoses    Encounter for supervision of normal first pregnancy in third trimester    -  Primary    Relevant Orders    POCT urinalysis dipstick (Completed)      There are no active problems to display for this patient.  Objective:    BP 132/77 mmHg  Pulse 73  Temp(Src) 98.4 F (36.9 C)  Wt 211 lb (95.709 kg)  LMP 08/15/2015 FHT:  145 BPM  Uterine Size: size equals dates  Presentation: cephalic     Assessment:    Pregnancy @ 4555w1d weeks   Doing well  Plan:     labs reviewed, problem list updated Consent signed. GBS planning TDAP offered  Rhogam given for RH negative Pediatrician: discussed. Infant feeding: plans to breastfeed. Maternity leave: discussed. Cigarette smoking: never smoked. Orders Placed This Encounter  Procedures  . POCT urinalysis dipstick   No orders of the defined types were placed in this encounter.   Follow up in 2 Weeks.

## 2016-03-27 ENCOUNTER — Ambulatory Visit (INDEPENDENT_AMBULATORY_CARE_PROVIDER_SITE_OTHER): Payer: Medicaid Other | Admitting: Certified Nurse Midwife

## 2016-03-27 VITALS — BP 109/72 | HR 69 | Temp 98.4°F | Wt 206.2 lb

## 2016-03-27 DIAGNOSIS — Z3403 Encounter for supervision of normal first pregnancy, third trimester: Secondary | ICD-10-CM | POA: Insufficient documentation

## 2016-03-27 NOTE — Assessment & Plan Note (Signed)
  Clinic  [facility]  Dating  LMP Blood type: B/NEG/-- (02/14 1346)   Genetic Screen 1 Screen:    AFP: 12/03/15: normal    Quad:     NIPS: Antibody:NEG (02/14 1346)  Anatomic US  01/16/16: normal Rubella: 11.50 (02/14 1346)  GTT Early:               Third trimester: 02/12/26: normal RPR: Non Reactive (05/25 1110)   Flu vaccine  HBsAg: NEGATIVE (02/14 1346)   TDaP vaccine                                               Rhogam: 02/29/16 HIV: Non Reactive (05/25 1110)   Baby Food            Breast                                    GBS: (For PCN allergy, check sensitivities)  Contraception  Mirena IUD Pap: N/A  Circumcision  [facility]   Pediatrician    Support Person

## 2016-03-27 NOTE — Progress Notes (Signed)
Pt states she is having increase in pelvic pain.  

## 2016-03-27 NOTE — Progress Notes (Signed)
Subjective:    Rachel BurowOlga Campos is a 20 y.o. female being seen today for her obstetrical visit. She is at 3913w1d gestation. Patient reports no complaints. Fetal movement: normal.  Problem List Items Addressed This Visit      Other   Supervision of normal first pregnancy in third trimester - Primary     Clinic  [facility]  Dating  LMP Blood type: B/NEG/-- (02/14 1346)   Genetic Screen 1 Screen:    AFP: 12/03/15: normal    Quad:     NIPS: Antibody:NEG (02/14 1346)  Anatomic US  01/16/16: normal Rubella: 11.50 (02/14 1346)  GTT Early:               Third trimester: 02/12/26: normal RPR: Non Reactive (05/25 1110)   Flu vaccine  HBsAg: NEGATIVE (02/14 1346)   TDaP vaccine                                               Rhogam: 02/29/16 HIV: Non Reactive (05/25 1110)   Baby Food            Breast                                    GBS: (For PCN allergy, check sensitivities)  Contraception  Mirena IUD Pap: N/A  Circumcision  [facility]   Pediatrician    Support Person            Patient Active Problem List   Diagnosis Date Noted  . Supervision of normal first pregnancy in third trimester 03/27/2016   Objective:    BP 109/72 mmHg  Pulse 69  Temp(Src) 98.4 F (36.9 C)  Wt 206 lb 3.2 oz (93.532 kg)  LMP 08/15/2015 FHT:  130 BPM  Uterine Size: size equals dates  Presentation: cephalic     Assessment:    Pregnancy @ 1013w1d weeks   Doing well  Plan:     labs reviewed, problem list updated Consent signed. GBS planning TDAP offered  Rhogam given for RH negative Pediatrician: discussed. Infant feeding: plans to breastfeed. Maternity leave: discussed. Cigarette smoking: never smoked. No orders of the defined types were placed in this encounter.   No orders of the defined types were placed in this encounter.   Follow up in 2 Weeks.

## 2016-03-28 ENCOUNTER — Encounter: Payer: Self-pay | Admitting: Certified Nurse Midwife

## 2016-04-10 ENCOUNTER — Ambulatory Visit (INDEPENDENT_AMBULATORY_CARE_PROVIDER_SITE_OTHER): Payer: Medicaid Other | Admitting: Certified Nurse Midwife

## 2016-04-10 VITALS — BP 118/69 | HR 89 | Temp 97.7°F | Wt 209.0 lb

## 2016-04-10 DIAGNOSIS — Z3403 Encounter for supervision of normal first pregnancy, third trimester: Secondary | ICD-10-CM

## 2016-04-10 NOTE — Assessment & Plan Note (Signed)
  Clinic  [facility]  Dating  LMP: 05/21/16 Blood type: B/NEG/-- (02/14 1346)   Genetic Screen 1 Screen:    AFP: 12/03/15: normal     Quad:     NIPS: Antibody:NEG (02/14 1346)  Anatomic US  18 wks: normal Rubella: 11.50 (02/14 1346)  GTT Early:               Third trimester: Normal  RPR: Non Reactive (05/25 1110)   Flu vaccine  HBsAg: NEGATIVE (02/14 1346)   TDaP vaccine                                               Rhogam: 02/29/16 HIV: Non Reactive (05/25 1110)   Baby Food   Breast                                            GBS: (For PCN allergy, check sensitivities)  Contraception   IUD Pap:N/A  Circumcision   [facility]   Pediatrician   [facility] [place]   Support Person   FOB: Deneen HartsKelvin Betha

## 2016-04-10 NOTE — Progress Notes (Signed)
Subjective:    Rachel Campos is a 20 y.o. female being seen today for her obstetrical visit. She is at 5635w1d gestation. Patient reports no complaints. Fetal movement: normal.  Is in the process of moving into an apartment.  Has had housing issues d/t rental house roof caving in.    Problem List Items Addressed This Visit      Other   Supervision of normal first pregnancy in third trimester - Primary     Clinic  [facility]  Dating  LMP: 05/21/16 Blood type: B/NEG/-- (02/14 1346)   Genetic Screen 1 Screen:    AFP: 12/03/15: normal     Quad:     NIPS: Antibody:NEG (02/14 1346)  Anatomic US  18 wks: normal Rubella: 11.50 (02/14 1346)  GTT Early:               Third trimester: Normal  RPR: Non Reactive (05/25 1110)   Flu vaccine  HBsAg: NEGATIVE (02/14 1346)   TDaP vaccine                                               Rhogam: 02/29/16 HIV: Non Reactive (05/25 1110)   Baby Food   Breast                                            GBS: (For PCN allergy, check sensitivities)  Contraception   IUD Pap:N/A  Circumcision   [facility]   Pediatrician   [facility] [place]   Support Person   FOB: Deneen HartsKelvin Betha            Patient Active Problem List   Diagnosis Date Noted  . Supervision of normal first pregnancy in third trimester 03/27/2016   Objective:    BP 118/69 mmHg  Pulse 89  Temp(Src) 97.7 F (36.5 C)  Wt 209 lb (94.802 kg)  LMP 08/15/2015 FHT:  153 BPM  Uterine Size: size equals dates  Presentation: cephalic     Assessment:    Pregnancy @ 2135w1d weeks   Plan:     labs reviewed, problem list updated Consent signed. GBS planning TDAP offered  Rhogam given for RH negative Pediatrician: discussed. Infant feeding: plans to breastfeed. Maternity leave: discussed. Cigarette smoking: never smoked. No orders of the defined types were placed in this encounter.   No orders of the defined types were placed in this encounter.   Follow up in 1 Week with GBS.

## 2016-04-17 ENCOUNTER — Ambulatory Visit (INDEPENDENT_AMBULATORY_CARE_PROVIDER_SITE_OTHER): Payer: Medicaid Other | Admitting: Certified Nurse Midwife

## 2016-04-17 VITALS — BP 120/75 | HR 77 | Temp 98.4°F | Wt 212.4 lb

## 2016-04-17 DIAGNOSIS — Z3403 Encounter for supervision of normal first pregnancy, third trimester: Secondary | ICD-10-CM

## 2016-04-17 LAB — POCT URINALYSIS DIPSTICK
Bilirubin, UA: NEGATIVE
Blood, UA: NEGATIVE
GLUCOSE UA: NEGATIVE
Ketones, UA: NEGATIVE
NITRITE UA: NEGATIVE
Protein, UA: NEGATIVE
Spec Grav, UA: <=1.005
UROBILINOGEN UA: NEGATIVE
pH, UA: 6

## 2016-04-17 NOTE — Progress Notes (Signed)
Subjective:    Rachel Campos is a 20 y.o. female being seen today for her obstetrical visit. She is at [redacted]w[redacted]d gestation. Patient reports no complaints and overnight was feeling ill, recently moved, has swollen ankles/feet today, VSS. Fetal movement: normal.  Problem List Items Addressed This Visit    None    Visit Diagnoses    Encounter for supervision of normal first pregnancy in third trimester    -  Primary   Relevant Orders   POCT urinalysis dipstick (Completed)   Strep Gp B NAA   NuSwab VG+, Candida 6sp     Patient Active Problem List   Diagnosis Date Noted  . Supervision of normal first pregnancy in third trimester 03/27/2016   Objective:    BP 120/75   Pulse 77   Temp 98.4 F (36.9 C)   Wt 212 lb 6.4 oz (96.3 kg)   LMP 08/15/2015   BMI 35.35 kg/m  FHT:  135 BPM  Uterine Size: size equals dates  Presentation: cephalic   Cervix: 1cm, posterior, soft, -3 station  Assessment:    Pregnancy @ [redacted]w[redacted]d weeks   Plan:    Out of work note for today   labs reviewed, problem list updated Consent signed. GBS sent TDAP offered  Rhogam given for RH negative Pediatrician: discussed. Infant feeding: plans to breastfeed. Maternity leave: discussed. Cigarette smoking: never smoked. Orders Placed This Encounter  Procedures  . Strep Gp B NAA  . POCT urinalysis dipstick   No orders of the defined types were placed in this encounter.  Follow up in 1 Week.

## 2016-04-19 LAB — STREP GP B NAA: Strep Gp B NAA: NEGATIVE

## 2016-04-21 ENCOUNTER — Other Ambulatory Visit: Payer: Self-pay | Admitting: Certified Nurse Midwife

## 2016-04-21 ENCOUNTER — Ambulatory Visit (INDEPENDENT_AMBULATORY_CARE_PROVIDER_SITE_OTHER): Payer: Medicaid Other | Admitting: Obstetrics & Gynecology

## 2016-04-21 VITALS — BP 111/73 | HR 80 | Temp 98.7°F | Wt 214.4 lb

## 2016-04-21 DIAGNOSIS — N760 Acute vaginitis: Principal | ICD-10-CM

## 2016-04-21 DIAGNOSIS — Z3403 Encounter for supervision of normal first pregnancy, third trimester: Secondary | ICD-10-CM

## 2016-04-21 DIAGNOSIS — B9689 Other specified bacterial agents as the cause of diseases classified elsewhere: Secondary | ICD-10-CM

## 2016-04-21 LAB — NUSWAB VG+, CANDIDA 6SP
CANDIDA ALBICANS, NAA: NEGATIVE
CANDIDA GLABRATA, NAA: NEGATIVE
CANDIDA LUSITANIAE, NAA: NEGATIVE
CANDIDA TROPICALIS, NAA: NEGATIVE
Candida krusei, NAA: NEGATIVE
Candida parapsilosis, NAA: NEGATIVE
Chlamydia trachomatis, NAA: NEGATIVE
Megasphaera 1: HIGH Score — AB
NEISSERIA GONORRHOEAE, NAA: NEGATIVE
Trich vag by NAA: NEGATIVE

## 2016-04-21 LAB — POCT URINALYSIS DIPSTICK
BILIRUBIN UA: NEGATIVE
GLUCOSE UA: 100
KETONES UA: NEGATIVE
NITRITE UA: NEGATIVE
Protein, UA: NEGATIVE
RBC UA: NEGATIVE
Spec Grav, UA: 1.01
Urobilinogen, UA: 0.2
pH, UA: 7

## 2016-04-21 LAB — OB RESULTS CONSOLE GBS: STREP GROUP B AG: NEGATIVE

## 2016-04-21 MED ORDER — METRONIDAZOLE 500 MG PO TABS: 500.0000 mg | ORAL_TABLET | Freq: Two times a day (BID) | ORAL | 0 refills | Status: DC

## 2016-04-21 NOTE — Patient Instructions (Addendum)
Return to clinic for any scheduled appointments or obstetric concerns, or go to [facility] for evaluation  Tdap Vaccine (Tetanus, Diphtheria and Pertussis): What You Need to Know 1. Why get vaccinated? Tetanus, diphtheria and pertussis are very serious diseases. Tdap vaccine can protect us from these diseases. And, Tdap vaccine given to pregnant women can protect newborn babies against pertussis. TETANUS (Lockjaw) is rare in the [place] today. It causes painful muscle tightening and stiffness, usually all over the body.  It can lead to tightening of muscles in the head and neck so you can't open your mouth, swallow, or sometimes even breathe. Tetanus kills about 1 out of 10 people who are infected even after receiving the best medical care. DIPHTHERIA is also rare in the [place] today. It can cause a thick coating to form in the back of the throat.  It can lead to breathing problems, heart failure, paralysis, and death. PERTUSSIS (Whooping Cough) causes severe coughing spells, which can cause difficulty breathing, vomiting and disturbed sleep.  It can also lead to weight loss, incontinence, and rib fractures. Up to 2 in 100 adolescents and 5 in 100 adults with pertussis are hospitalized or have complications, which could include pneumonia or death. These diseases are caused by bacteria. Diphtheria and pertussis are spread from person to person through secretions from coughing or sneezing. Tetanus enters the body through cuts, scratches, or wounds. Before vaccines, as many as 200,000 cases of diphtheria, 200,000 cases of pertussis, and hundreds of cases of tetanus, were reported in the [place] each year. Since vaccination began, reports of cases for tetanus and diphtheria have dropped by about 99% and for pertussis by about 80%. 2. Tdap vaccine Tdap vaccine can protect adolescents and adults from tetanus, diphtheria, and pertussis. One dose of Tdap is routinely given at age 11 or 12.  People who did not get Tdap at that age should get it as soon as possible. Tdap is especially important for healthcare professionals and anyone having close contact with a baby younger than 12 months. Pregnant women should get a dose of Tdap during every pregnancy, to protect the newborn from pertussis. Infants are most at risk for severe, life-threatening complications from pertussis. Another vaccine, called Td, protects against tetanus and diphtheria, but not pertussis. A Td booster should be given every 10 years. Tdap may be given as one of these boosters if you have never gotten Tdap before. Tdap may also be given after a severe cut or burn to prevent tetanus infection. Your doctor or the person giving you the vaccine can give you more information. Tdap may safely be given at the same time as other vaccines. 3. Some people should not get this vaccine  A person who has ever had a life-threatening allergic reaction after a previous dose of any diphtheria, tetanus or pertussis containing vaccine, OR has a severe allergy to any part of this vaccine, should not get Tdap vaccine. Tell the person giving the vaccine about any severe allergies.  Anyone who had coma or long repeated seizures within 7 days after a childhood dose of DTP or DTaP, or a previous dose of Tdap, should not get Tdap, unless a cause other than the vaccine was found. They can still get Td.  Talk to your doctor if you:  have seizures or another nervous system problem,  had severe pain or swelling after any vaccine containing diphtheria, tetanus or pertussis,  ever had a condition called Guillain-Barr Syndrome (GBS),  aren't feeling   well on the day the shot is scheduled. 4. Risks With any medicine, including vaccines, there is a chance of side effects. These are usually mild and go away on their own. Serious reactions are also possible but are rare. Most people who get Tdap vaccine do not have any problems with it. Mild  problems following Tdap (Did not interfere with activities)  Pain where the shot was given (about 3 in 4 adolescents or 2 in 3 adults)  Redness or swelling where the shot was given (about 1 person in 5)  Mild fever of at least 100.4F (up to about 1 in 25 adolescents or 1 in 100 adults)  Headache (about 3 or 4 people in 10)  Tiredness (about 1 person in 3 or 4)  Nausea, vomiting, diarrhea, stomach ache (up to 1 in 4 adolescents or 1 in 10 adults)  Chills, sore joints (about 1 person in 10)  Body aches (about 1 person in 3 or 4)  Rash, swollen glands (uncommon) Moderate problems following Tdap (Interfered with activities, but did not require medical attention)  Pain where the shot was given (up to 1 in 5 or 6)  Redness or swelling where the shot was given (up to about 1 in 16 adolescents or 1 in 12 adults)  Fever over 102F (about 1 in 100 adolescents or 1 in 250 adults)  Headache (about 1 in 7 adolescents or 1 in 10 adults)  Nausea, vomiting, diarrhea, stomach ache (up to 1 or 3 people in 100)  Swelling of the entire arm where the shot was given (up to about 1 in 500). Severe problems following Tdap (Unable to perform usual activities; required medical attention)  Swelling, severe pain, bleeding and redness in the arm where the shot was given (rare). Problems that could happen after any vaccine:  People sometimes faint after a medical procedure, including vaccination. Sitting or lying down for about 15 minutes can help prevent fainting, and injuries caused by a fall. Tell your doctor if you feel dizzy, or have vision changes or ringing in the ears.  Some people get severe pain in the shoulder and have difficulty moving the arm where a shot was given. This happens very rarely.  Any medication can cause a severe allergic reaction. Such reactions from a vaccine are very rare, estimated at fewer than 1 in a million doses, and would happen within a few minutes to a few hours  after the vaccination. As with any medicine, there is a very remote chance of a vaccine causing a serious injury or death. The safety of vaccines is always being monitored. For more information, visit: www.cdc.gov/vaccinesafety/ 5. What if there is a serious problem? What should I look for?  Look for anything that concerns you, such as signs of a severe allergic reaction, very high fever, or unusual behavior.  Signs of a severe allergic reaction can include hives, swelling of the face and throat, difficulty breathing, a fast heartbeat, dizziness, and weakness. These would usually start a few minutes to a few hours after the vaccination. What should I do?  If you think it is a severe allergic reaction or other emergency that can't wait, call 9-1-1 or get the person to the nearest hospital. Otherwise, call your doctor.  Afterward, the reaction should be reported to the Vaccine Adverse Event Reporting System (VAERS). Your doctor might file this report, or you can do it yourself through the VAERS web site at www.vaers.hhs.gov, or by calling 1-800-822-7967. VAERS does not   give medical advice.  6. The National Vaccine Injury Compensation Program The National Vaccine Injury Compensation Program (VICP) is a federal program that was created to compensate people who may have been injured by certain vaccines. Persons who believe they may have been injured by a vaccine can learn about the program and about filing a claim by calling 1-800-338-2382 or visiting the VICP website at www.hrsa.gov/vaccinecompensation. There is a time limit to file a claim for compensation. 7. How can I learn more?  Ask your doctor. He or she can give you the vaccine package insert or suggest other sources of information.  Call your local or state health department.  Contact the Centers for Disease Control and Prevention (CDC):  Call 1-800-232-4636 (1-800-CDC-INFO) or  Visit CDC's website at www.cdc.gov/vaccines CDC Tdap  Vaccine VIS (11/14/13)   This information is not intended to replace advice given to you by your health care provider. Make sure you discuss any questions you have with your health care provider.   Document Released: 03/08/2012 Document Revised: 09/28/2014 Document Reviewed: 12/20/2013 Elsevier Interactive Patient Education 2016 Elsevier Inc.  

## 2016-04-21 NOTE — Progress Notes (Signed)
Subjective:  Rachel Campos is a 20 y.o. G1P0 at [redacted]w[redacted]d being seen today for ongoing prenatal care.  She is currently monitored for the following issues for this low-risk pregnancy and has Supervision of normal first pregnancy in third trimester on her problem list.  Patient reports no complaints.  Contractions: Not present. Vag. Bleeding: None.  Movement: Present. Denies leaking of fluid.   The following portions of the patient's history were reviewed and updated as appropriate: allergies, current medications, past family history, past medical history, past social history, past surgical history and problem list. Problem list updated.  Objective:   Vitals:   04/21/16 0859  BP: 111/73  Pulse: 80  Temp: 98.7 F (37.1 C)  Weight: 214 lb 6.4 oz (97.3 kg)    Fetal Status: Fetal Heart Rate (bpm): 145 Fundal Height: 36 cm Movement: Present  Presentation: Vertex  General:  Alert, oriented and cooperative. Patient is in no acute distress.  Skin: Skin is warm and dry. No rash noted.   Cardiovascular: Normal heart rate noted  Respiratory: Normal respiratory effort, no problems with respiration noted  Abdomen: Soft, gravid, appropriate for gestational age. Pain/Pressure: Present     Pelvic:  Cervical exam performed Dilation: Closed Effacement (%): 50 Station: Ballotable  Extremities: Normal range of motion.  Edema: Trace  Mental Status: Normal mood and affect. Normal behavior. Normal judgment and thought content.   Urinalysis: Urine Protein: Negative Urine Glucose: Negative  Assessment and Plan:  Pregnancy: G1P0 at [redacted]w[redacted]d  1. Supervision of normal first pregnancy in third trimester Pelvic cultures done today. Counseled about Tdap, will decide by next visit. - POCT Urinalysis Dipstick - Strep Gp B NAA - GC/Chlamydia Probe Amp Preterm labor symptoms and general obstetric precautions including but not limited to vaginal bleeding, contractions, leaking of fluid and fetal movement were reviewed in  detail with the patient. Please refer to After Visit Summary for other counseling recommendations.  Return in about 1 week (around 04/28/2016) for OB Visit.   Tereso Newcomer, MD

## 2016-04-23 LAB — GC/CHLAMYDIA PROBE AMP
Chlamydia trachomatis, NAA: NEGATIVE
Neisseria gonorrhoeae by PCR: NEGATIVE

## 2016-04-23 LAB — STREP GP B NAA: STREP GROUP B AG: NEGATIVE

## 2016-04-29 ENCOUNTER — Ambulatory Visit (INDEPENDENT_AMBULATORY_CARE_PROVIDER_SITE_OTHER): Payer: Medicaid Other | Admitting: Certified Nurse Midwife

## 2016-04-29 ENCOUNTER — Encounter: Payer: Self-pay | Admitting: *Deleted

## 2016-04-29 VITALS — BP 128/83 | HR 93 | Temp 98.5°F | Wt 220.2 lb

## 2016-04-29 DIAGNOSIS — Z3403 Encounter for supervision of normal first pregnancy, third trimester: Secondary | ICD-10-CM

## 2016-04-29 LAB — POCT URINALYSIS DIPSTICK
Bilirubin, UA: NEGATIVE
Blood, UA: NEGATIVE
Glucose, UA: NEGATIVE
Ketones, UA: NEGATIVE
NITRITE UA: NEGATIVE
Protein, UA: NEGATIVE
Spec Grav, UA: <=1.005
UROBILINOGEN UA: NEGATIVE
pH, UA: 5

## 2016-04-29 NOTE — Progress Notes (Signed)
Patient is having some discomfort in her back and lower abdomen.

## 2016-04-29 NOTE — Progress Notes (Signed)
Subjective:    Rachel BurowOlga Campos is a 20 y.o. female being seen today for her obstetrical visit. She is at 454w6d gestation. Patient reports backache, no bleeding, no leaking and occasional contractions. Fetal movement: normal.  Problem List Items Addressed This Visit    None    Visit Diagnoses    Encounter for supervision of normal first pregnancy in third trimester    -  Primary   Relevant Orders   POCT urinalysis dipstick (Completed)     Patient Active Problem List   Diagnosis Date Noted  . Supervision of normal first pregnancy in third trimester 03/27/2016   Objective:    BP 128/83   Pulse 93   Temp 98.5 F (36.9 C)   Wt 220 lb 3.2 oz (99.9 kg)   LMP 08/15/2015   BMI 36.64 kg/m  FHT:  140 BPM  Uterine Size: 37 cm and size equals dates  Presentation: cephalic   Cervix: 1 cm, 50%, posterior, -3 station  Assessment:    Pregnancy @ 784w6d weeks   Doing well  Normal discomforts of pregnancy Plan:     labs reviewed, problem list updated Consent signed. GBS results reviewed: Negative TDAP offered  Rhogam given for RH negative Pediatrician: discussed. Infant feeding: plans to breastfeed. Maternity leave: discussed. Cigarette smoking: never smoked. Orders Placed This Encounter  Procedures  . POCT urinalysis dipstick   No orders of the defined types were placed in this encounter.  Follow up in 1 Week.

## 2016-05-08 ENCOUNTER — Ambulatory Visit (INDEPENDENT_AMBULATORY_CARE_PROVIDER_SITE_OTHER): Payer: Medicaid Other | Admitting: Certified Nurse Midwife

## 2016-05-08 VITALS — BP 129/81 | HR 98 | Temp 98.8°F | Wt 222.0 lb

## 2016-05-08 DIAGNOSIS — Z3403 Encounter for supervision of normal first pregnancy, third trimester: Secondary | ICD-10-CM

## 2016-05-08 LAB — POCT URINALYSIS DIPSTICK
Bilirubin, UA: NEGATIVE
Blood, UA: NEGATIVE
Glucose, UA: NEGATIVE
Ketones, UA: NEGATIVE
Nitrite, UA: NEGATIVE
PH UA: 6
PROTEIN UA: NEGATIVE
SPEC GRAV UA: 1.015
UROBILINOGEN UA: NEGATIVE

## 2016-05-08 NOTE — Progress Notes (Signed)
Subjective:    Rachel BurowOlga Campos is a 20 y.o. female being seen today for her obstetrical visit. She is at 6649w1d gestation. Patient reports no complaints. Fetal movement: normal.  Problem List Items Addressed This Visit    None    Visit Diagnoses    Encounter for supervision of normal first pregnancy in third trimester    -  Primary     Patient Active Problem List   Diagnosis Date Noted  . Supervision of normal first pregnancy in third trimester 03/27/2016    Objective:    BP 129/81   Pulse 98   Temp 98.8 F (37.1 C)   Wt 222 lb (100.7 kg)   LMP 08/15/2015   BMI 36.94 kg/m  FHT: 135 BPM  Uterine Size: 39 cm and size equals dates  Presentations: cephalic  Pelvic Exam: deferred     Assessment:     IUP @ 6949w1d weeks   Doing well  Plan:   Plans for delivery: Vaginal anticipated; labs reviewed; problem list updated Counseling: Consent signed. Infant feeding: plans to breastfeed. Cigarette smoking: never smoked. L&D discussion: symptoms of labor, discussed when to call, discussed what number to call, anesthetic/analgesic options reviewed and delivering clinician:  plans Certified Nurse-Midwife. Postpartum supports and preparation: circumcision discussed and contraception plans discussed.  Follow up in 1 Week.

## 2016-05-08 NOTE — Addendum Note (Signed)
Addended by: Luella CookMCINTYRE, Valentine Barney E on: 05/08/2016 10:39 AM   Modules accepted: Orders

## 2016-05-09 ENCOUNTER — Inpatient Hospital Stay (HOSPITAL_COMMUNITY): Payer: Medicaid Other | Admitting: Anesthesiology

## 2016-05-09 ENCOUNTER — Encounter (HOSPITAL_COMMUNITY): Payer: Self-pay | Admitting: *Deleted

## 2016-05-09 ENCOUNTER — Inpatient Hospital Stay (HOSPITAL_COMMUNITY)
Admission: AD | Admit: 2016-05-09 | Discharge: 2016-05-12 | DRG: 775 | Disposition: A | Discharge status: Home / Self Care | Payer: Medicaid Other | Source: Ambulatory Visit | Attending: Obstetrics and Gynecology | Admitting: Obstetrics and Gynecology

## 2016-05-09 DIAGNOSIS — Z3A38 38 weeks gestation of pregnancy: Secondary | ICD-10-CM | POA: Diagnosis not present

## 2016-05-09 DIAGNOSIS — Z87891 Personal history of nicotine dependence: Secondary | ICD-10-CM

## 2016-05-09 DIAGNOSIS — O4202 Full-term premature rupture of membranes, onset of labor within 24 hours of rupture: Secondary | ICD-10-CM | POA: Diagnosis present

## 2016-05-09 DIAGNOSIS — IMO0001 Reserved for inherently not codable concepts without codable children: Secondary | ICD-10-CM

## 2016-05-09 LAB — CBC
HEMATOCRIT: 33.1 % — AB (ref 36.0–46.0)
HEMOGLOBIN: 11.5 g/dL — AB (ref 12.0–15.0)
MCH: 30.3 pg (ref 26.0–34.0)
MCHC: 34.7 g/dL (ref 30.0–36.0)
MCV: 87.3 fL (ref 78.0–100.0)
Platelets: 262 10*3/uL (ref 150–400)
RBC: 3.79 MIL/uL — ABNORMAL LOW (ref 3.87–5.11)
RDW: 13.5 % (ref 11.5–15.5)
WBC: 14 10*3/uL — AB (ref 4.0–10.5)

## 2016-05-09 LAB — POCT FERN TEST

## 2016-05-09 MED ORDER — EPHEDRINE 5 MG/ML INJ
INTRAVENOUS | Status: AC
  Filled 2016-05-09: qty 4

## 2016-05-09 MED ORDER — DIPHENHYDRAMINE HCL 50 MG/ML IJ SOLN: 12.5000 mg | INTRAMUSCULAR | Status: DC | PRN

## 2016-05-09 MED ORDER — OXYCODONE-ACETAMINOPHEN 5-325 MG PO TABS: 2.0000 | ORAL_TABLET | ORAL | Status: DC | PRN

## 2016-05-09 MED ORDER — EPHEDRINE 5 MG/ML INJ
10.0000 mg | INTRAVENOUS | Status: DC | PRN
  Filled 2016-05-09: qty 4

## 2016-05-09 MED ORDER — FLEET ENEMA 7-19 GM/118ML RE ENEM: 1.0000 | ENEMA | RECTAL | Status: DC | PRN

## 2016-05-09 MED ORDER — FENTANYL 2.5 MCG/ML BUPIVACAINE 1/10 % EPIDURAL INFUSION (WH - ANES): 14.0000 mL/h | INTRAMUSCULAR | Status: DC | PRN

## 2016-05-09 MED ORDER — OXYTOCIN 40 UNITS IN LACTATED RINGERS INFUSION - SIMPLE MED
2.5000 [IU]/h | INTRAVENOUS | Status: DC
  Filled 2016-05-09: qty 1000

## 2016-05-09 MED ORDER — LACTATED RINGERS IV SOLN: 500.0000 mL | Freq: Once | INTRAVENOUS | Status: DC

## 2016-05-09 MED ORDER — PHENYLEPHRINE 40 MCG/ML (10ML) SYRINGE FOR IV PUSH (FOR BLOOD PRESSURE SUPPORT)
80.0000 ug | PREFILLED_SYRINGE | INTRAVENOUS | Status: DC | PRN
  Filled 2016-05-09: qty 5

## 2016-05-09 MED ORDER — LIDOCAINE HCL (PF) 1 % IJ SOLN
INTRAMUSCULAR | Status: DC | PRN
  Administered 2016-05-09 (×2): 4 mL via EPIDURAL
  Administered 2016-05-09: 2 mL via EPIDURAL

## 2016-05-09 MED ORDER — FENTANYL 2.5 MCG/ML BUPIVACAINE 1/10 % EPIDURAL INFUSION (WH - ANES)
14.0000 mL/h | INTRAMUSCULAR | Status: DC | PRN
  Administered 2016-05-09: 14 mL/h via EPIDURAL

## 2016-05-09 MED ORDER — TERBUTALINE SULFATE 1 MG/ML IJ SOLN
0.2500 mg | Freq: Once | INTRAMUSCULAR | Status: DC | PRN
  Filled 2016-05-09: qty 1

## 2016-05-09 MED ORDER — PHENYLEPHRINE 40 MCG/ML (10ML) SYRINGE FOR IV PUSH (FOR BLOOD PRESSURE SUPPORT)
PREFILLED_SYRINGE | INTRAVENOUS | Status: AC
  Filled 2016-05-09: qty 20

## 2016-05-09 MED ORDER — LACTATED RINGERS IV SOLN
500.0000 mL | INTRAVENOUS | Status: DC | PRN
Start: 2016-05-09 — End: 2016-05-10

## 2016-05-09 MED ORDER — ACETAMINOPHEN 325 MG PO TABS: 650.0000 mg | ORAL_TABLET | ORAL | Status: DC | PRN

## 2016-05-09 MED ORDER — ONDANSETRON HCL 4 MG/2ML IJ SOLN: 4.0000 mg | Freq: Four times a day (QID) | INTRAMUSCULAR | Status: DC | PRN

## 2016-05-09 MED ORDER — LIDOCAINE HCL (PF) 1 % IJ SOLN
30.0000 mL | INTRAMUSCULAR | Status: DC | PRN
  Filled 2016-05-09: qty 30

## 2016-05-09 MED ORDER — FENTANYL 2.5 MCG/ML BUPIVACAINE 1/10 % EPIDURAL INFUSION (WH - ANES)
INTRAMUSCULAR | Status: AC
  Filled 2016-05-09: qty 125

## 2016-05-09 MED ORDER — LACTATED RINGERS IV SOLN
INTRAVENOUS | Status: DC
  Administered 2016-05-09: 17:00:00 via INTRAVENOUS

## 2016-05-09 MED ORDER — OXYCODONE-ACETAMINOPHEN 5-325 MG PO TABS: 1.0000 | ORAL_TABLET | ORAL | Status: DC | PRN

## 2016-05-09 MED ORDER — SOD CITRATE-CITRIC ACID 500-334 MG/5ML PO SOLN: 30.0000 mL | ORAL | Status: DC | PRN

## 2016-05-09 MED ORDER — FENTANYL CITRATE (PF) 100 MCG/2ML IJ SOLN
100.0000 ug | INTRAMUSCULAR | Status: DC | PRN
  Administered 2016-05-09: 100 ug via INTRAVENOUS
  Filled 2016-05-09: qty 2

## 2016-05-09 MED ORDER — OXYTOCIN 40 UNITS IN LACTATED RINGERS INFUSION - SIMPLE MED
1.0000 m[IU]/min | INTRAVENOUS | Status: DC
  Administered 2016-05-09: 2 m[IU]/min via INTRAVENOUS

## 2016-05-09 MED ORDER — OXYTOCIN BOLUS FROM INFUSION
500.0000 mL | Freq: Once | INTRAVENOUS | Status: AC
  Administered 2016-05-10: 500 mL via INTRAVENOUS

## 2016-05-09 NOTE — Anesthesia Procedure Notes (Signed)
Epidural Patient location during procedure: OB  Staffing Anesthesiologist: Alanzo Lamb Performed: anesthesiologist   Preanesthetic Checklist Completed: patient identified, site marked, surgical consent, pre-op evaluation, timeout performed, IV checked, risks and benefits discussed and monitors and equipment checked  Epidural Patient position: sitting Prep: site prepped and draped and DuraPrep Patient monitoring: continuous pulse ox and blood pressure Approach: midline Location: L3-L4 Injection technique: LOR saline  Needle:  Needle type: Tuohy  Needle gauge: 17 G Needle length: 9 cm and 9 Needle insertion depth: 7 cm Catheter type: closed end flexible Catheter size: 19 Gauge Catheter at skin depth: 13 cm Test dose: negative  Assessment Events: blood not aspirated, injection not painful, no injection resistance, negative IV test and no paresthesia  Additional Notes Patient identified. Risks/Benefits/Options discussed with patient including but not limited to bleeding, infection, nerve damage, paralysis, failed block, incomplete pain control, headache, blood pressure changes, nausea, vomiting, reactions to medications, itching and postpartum back pain. Confirmed with bedside nurse the patient's most recent platelet count. Confirmed with patient that they are not currently taking any anticoagulation, have any bleeding history or any family history of bleeding disorders. Patient expressed understanding and wished to proceed. All questions were answered. Sterile technique was used throughout the entire procedure. Please see nursing notes for vital signs. Test dose was given through epidural catheter and negative prior to continuing to dose epidural or start infusion. Warning signs of high block given to the patient including shortness of breath, tingling/numbness in hands, complete motor block, or any concerning symptoms with instructions to call for help. Patient was given instructions  on fall risk and not to get out of bed. All questions and concerns addressed with instructions to call with any issues or inadequate analgesia.  Reason for block:procedure for pain

## 2016-05-09 NOTE — H&P (Signed)
LABOR AND DELIVERY ADMISSION HISTORY AND PHYSICAL NOTE  Rachel Campos is a 20 y.o. female G1P0 with IUP at 4026w2d by LMP presenting for PROM around 10am today. Patient states big gush of fluids at home around 10am today    She reports positive fetal movement. She denies leakage of fluid or vaginal bleeding.  Prenatal History/Complications:  Past Medical History: Past Medical History:  Diagnosis Date  . Medical history non-contributory     Past Surgical History: Past Surgical History:  Procedure Laterality Date  . NO PAST SURGERIES      Obstetrical History: OB History    Gravida Para Term Preterm AB Living   1             SAB TAB Ectopic Multiple Live Births                  Social History: Social History   Social History  . Marital status: Single    Spouse name: N/A  . Number of children: N/A  . Years of education: N/A   Social History Main Topics  . Smoking status: Former Games developermoker  . Smokeless tobacco: Never Used  . Alcohol use No  . Drug use: No  . Sexual activity: Yes    Birth control/ protection: None   Other Topics Concern  . None   Social History Narrative  . None    Family History: Family History  Problem Relation Age of Onset  . Adopted: Yes  . Family history unknown: Yes    Allergies: No Known Allergies  Prescriptions Prior to Admission  Medication Sig Dispense Refill Last Dose  . Prenat-Fe Poly-Methfol-FA-DHA (VITAFOL ULTRA) 29-0.6-0.4-200 MG CAPS Take 1 capsule by mouth daily before breakfast. 90 capsule 3 05/08/2016 at Unknown time  . cyclobenzaprine (FLEXERIL) 5 MG tablet Take 1 tablet (5 mg total) by mouth 3 (three) times daily as needed for muscle spasms. (Patient not taking: Reported on 05/09/2016) 6 tablet 0 Not Taking at Unknown time     Review of Systems   All systems reviewed and negative except as stated in HPI  Blood pressure 127/82, temperature 98.9 F (37.2 C), temperature source Oral, height 5\' 5"  (1.651 m), weight 100.7  kg (222 lb), last menstrual period 08/15/2015. General appearance: alert, cooperative and no distress Lungs: no respiratory distress Heart: regular rate Abdomen: soft, non-tender Extremities: No calf swelling or tenderness Presentation: cephalic by cervical exam Fetal monitoring: 130, moderate variability, +accelerations, no decelerations Uterine activity: irregular Dilation: 1 Effacement (%): 50 Station: -3 Exam by:: Vira BlancoShimica Robinson, RN   Prenatal labs: ABO, Rh: B/NEG/-- (02/14 1346) Antibody: NEG (02/14 1346) Rubella: immune RPR: Non Reactive (05/25 1110)  HBsAg: NEGATIVE (02/14 1346)  HIV: Non Reactive (05/25 1110)  GBS: Negative (08/01 1408)  Genetic screening:  normal Anatomy US: normal  Prenatal Transfer Tool  Maternal Diabetes: No Genetic Screening: Normal Maternal Ultrasounds/Referrals: Normal Fetal Ultrasounds or other Referrals:  None Maternal Substance Abuse:  No Significant Maternal Medications:  None Significant Maternal Lab Results: Lab values include: Group B Strep negative  Results for orders placed or performed during the hospital encounter of 05/09/16 (from the past 24 hour(s))  Fern Test   Collection Time: 05/09/16 11:46 AM  Result Value Ref Range   POCT Fern Test      Patient Active Problem List   Diagnosis Date Noted  . Supervision of normal first pregnancy in third trimester 03/27/2016    Assessment: Rachel BurowOlga Loiselle is a [age] y.o. G1P0 at 6126w2d here  for  IOL 2/2 PROM  #Labor:pitocin #Pain: Natural, discussed options #FWB: Category I #ID:  GBS neg  #MOF: breast #MOC: IUD #Circ:  outpatient  Leland HerElsia J Yoo, DO PGY-1 8/19/20173:28 PM   I spoke with and examined patient and agree with resident/PA/SNM's note and plan of care.  Pt to floor from holding in MAU, not feeling any uc's, will place foley bulb/start low-dose pit Cheral MarkerKimberly R. Rashee Marschall, CNM, [facility]NP-BC 05/09/2016 4:55 PM

## 2016-05-09 NOTE — Anesthesia Preprocedure Evaluation (Signed)
Anesthesia Evaluation  Patient identified by MRN, date of birth, ID band Patient awake    Reviewed: Allergy & Precautions, H&P , NPO status , Patient's Chart, lab work & pertinent test results  Airway Mallampati: II  TM Distance: >3 FB Neck ROM: full    Dental no notable dental hx.    Pulmonary former smoker,    Pulmonary exam normal breath sounds clear to auscultation       Cardiovascular negative cardio ROS Normal cardiovascular exam Rhythm:regular Rate:Normal     Neuro/Psych negative neurological ROS  negative psych ROS   GI/Hepatic negative GI ROS, Neg liver ROS,   Endo/Other  negative endocrine ROS  Renal/GU negative Renal ROS  negative genitourinary   Musculoskeletal   Abdominal   Peds  Hematology negative hematology ROS (+)   Anesthesia Other Findings Pregnancy - obesity Platelets and allergies reviewed Denies active cardiac or pulmonary symptoms, METS > 4  Denies blood thinning medications, bleeding disorders, hypertension, asthma, supine hypotension syndrome, previous anesthesia difficulties   Reproductive/Obstetrics (+) Pregnancy                             Anesthesia Physical Anesthesia Plan  ASA: III  Anesthesia Plan: Epidural   Post-op Pain Management:    Induction:   Airway Management Planned:   Additional Equipment:   Intra-op Plan:   Post-operative Plan:   Informed Consent: I have reviewed the patients History and Physical, chart, labs and discussed the procedure including the risks, benefits and alternatives for the proposed anesthesia with the patient or authorized representative who has indicated his/her understanding and acceptance.     Plan Discussed with:   Anesthesia Plan Comments:         Anesthesia Quick Evaluation

## 2016-05-09 NOTE — Progress Notes (Signed)
Discussed with Dr. Artist PaisYoo, patients presenting complaints. Notified of rupture, GBS neg, cervical exam.  Orders for admission pt may ambulate if FHT is reactive

## 2016-05-09 NOTE — MAU Note (Signed)
Woke up at 10 am.  Clear fluid

## 2016-05-09 NOTE — Progress Notes (Signed)
Labor Progress Note Eben BurowOlga Campos is a 20 y.o. G1P0 at 3451w2d presented for PROM  S: Doing well,  contractions every 3 minutes. Patient just had an epidural   O:  BP 136/81   Pulse 77   Temp 98.2 F (36.8 C) (Oral)   Resp 18   Ht 5\' 5"  (1.651 m)   Wt 100.7 kg (222 lb)   LMP 08/15/2015   SpO2 98%   BMI 36.94 kg/m  EFM: 120/moderate/present   CVE: Dilation: 6 Effacement (%): 80 Cervical Position: Posterior Station: 0, +1 Presentation: Vertex Exam by:: Cletis MediaK. Anderson, RN   A&P: 20 y.o. G1P0 7751w2d for PROM, foley bulb out, now with pitocin   #Labor: Pitocin  #Pain:  Epidural  #FWB: Category  #GBS negative   Rachel Angelene GiovanniZ Mikell, MD 10:28 PM

## 2016-05-09 NOTE — Anesthesia Pain Management Evaluation Note (Signed)
  CRNA Pain Management Visit Note  Patient: Eben BurowOlga Bures, 20 y.o., female  "Hello [facility]. We have an anesthesia team available at all times to provide care throughout the hospital, including epidural management and anesthesia for C-section. I don't know your plan for the delivery whether it a natural birth, water birth, IV sedation, nitrous supplementation, doula or epidural, but we want to meet your pain goals."   1.Was your pain managed to your expectations on prior hospitalizations?   No prior hospitalizations  2.What is your expectation for pain management during this hospitalization?     Labor support without medications  3.How can we help you reach that goal? natural  Record the patient's initial score and the patient's pain goal.   Pain: 8  Pain Goal: 8 [facility] wants you to be able to say your pain was always managed very well.  Taylor Spilde 05/09/2016

## 2016-05-09 NOTE — Progress Notes (Signed)
Patient ID: Rachel Campos Gruner, female   DOB: 04/30/1996, 20 y.o.   MRN: [id number] LABOR PROGRESS NOTE  Rachel Campos Colan is a 20 y.o. G1P0 at 2944w2d  admitted for IOL 2/2 PROM  Subjective: Doing well, feeling some irregular pressure in lower back that she thinks may be ctx.  Objective: BP 127/82 (BP Location: Left Arm)   Temp 98.9 F (37.2 C) (Oral)   Ht 5\' 5"  (1.651 m)   Wt 100.7 kg (222 lb)   LMP 08/15/2015   BMI 36.94 kg/m  or  Vitals:   05/09/16 1141 05/09/16 1147 05/09/16 1201  BP: 141/78 142/89 127/82  Temp: 98.9 F (37.2 C)    TempSrc: Oral    Weight: 100.7 kg (222 lb)    Height: 5\' 5"  (1.651 m)      FHT: 125 baseline, mod variability, +accelerations, no decelerations Dilation: 1 Effacement (%): 50 Cervical Position: Posterior Station: -3 Presentation: Vertex Exam by:: Vira BlancoShimica Robinson, RN  2/30/-2 exam by Joellyn HaffKim Booker, CNM  Cervical foley bulb inserted and inflated w/ 60ml LR w/o difficulty   Labs: Lab Results  Component Value Date   WBC 14.0 (H) 05/09/2016   HGB 11.5 (L) 05/09/2016   HCT 33.1 (L) 05/09/2016   MCV 87.3 05/09/2016   PLT 262 05/09/2016    Patient Active Problem List   Diagnosis Date Noted  . Active labor 05/09/2016  . Supervision of normal first pregnancy in third trimester 03/27/2016    Assessment / Plan: 20 y.o. G1P0 at 9044w2d here for IOL 2/2 PROM. Now has cervical foley bulb, will plan pitocin per protocol when it falls out.  Labor: foley bulb with low dose pitocin for now. Fetal Wellbeing:  Category I Pain Control:  Natural birth Anticipated MOD:  SVD  Leland HerElsia J Yoo, DO  PGY-1, [facility] 8/19/20174:46 PM

## 2016-05-10 ENCOUNTER — Encounter (HOSPITAL_COMMUNITY): Payer: Self-pay

## 2016-05-10 DIAGNOSIS — Z87891 Personal history of nicotine dependence: Secondary | ICD-10-CM

## 2016-05-10 DIAGNOSIS — Z3A38 38 weeks gestation of pregnancy: Secondary | ICD-10-CM

## 2016-05-10 DIAGNOSIS — O4202 Full-term premature rupture of membranes, onset of labor within 24 hours of rupture: Secondary | ICD-10-CM

## 2016-05-10 LAB — RPR: RPR Ser Ql: NONREACTIVE

## 2016-05-10 MED ORDER — IBUPROFEN 600 MG PO TABS
600.0000 mg | ORAL_TABLET | Freq: Four times a day (QID) | ORAL | Status: DC
  Administered 2016-05-10 – 2016-05-12 (×8): 600 mg via ORAL
  Filled 2016-05-10 (×8): qty 1

## 2016-05-10 MED ORDER — ACETAMINOPHEN 325 MG PO TABS: 650.0000 mg | ORAL_TABLET | ORAL | Status: DC | PRN

## 2016-05-10 MED ORDER — WITCH HAZEL-GLYCERIN EX PADS: 1.0000 "application " | MEDICATED_PAD | CUTANEOUS | Status: DC | PRN

## 2016-05-10 MED ORDER — DIPHENHYDRAMINE HCL 25 MG PO CAPS: 25.0000 mg | ORAL_CAPSULE | Freq: Four times a day (QID) | ORAL | Status: DC | PRN

## 2016-05-10 MED ORDER — BENZOCAINE-MENTHOL 20-0.5 % EX AERO: 1.0000 "application " | INHALATION_SPRAY | CUTANEOUS | Status: DC | PRN

## 2016-05-10 MED ORDER — ZOLPIDEM TARTRATE 5 MG PO TABS: 5.0000 mg | ORAL_TABLET | Freq: Every evening | ORAL | Status: DC | PRN

## 2016-05-10 MED ORDER — SENNOSIDES-DOCUSATE SODIUM 8.6-50 MG PO TABS
2.0000 | ORAL_TABLET | ORAL | Status: DC
  Administered 2016-05-11 – 2016-05-12 (×2): 2 via ORAL
  Filled 2016-05-10 (×2): qty 2

## 2016-05-10 MED ORDER — TETANUS-DIPHTH-ACELL PERTUSSIS 5-2.5-18.5 LF-MCG/0.5 IM SUSP: 0.5000 mL | Freq: Once | INTRAMUSCULAR | Status: DC

## 2016-05-10 MED ORDER — SIMETHICONE 80 MG PO CHEW: 80.0000 mg | CHEWABLE_TABLET | ORAL | Status: DC | PRN

## 2016-05-10 MED ORDER — ONDANSETRON HCL 4 MG/2ML IJ SOLN: 4.0000 mg | INTRAMUSCULAR | Status: DC | PRN

## 2016-05-10 MED ORDER — DIBUCAINE 1 % RE OINT: 1.0000 "application " | TOPICAL_OINTMENT | RECTAL | Status: DC | PRN

## 2016-05-10 MED ORDER — COCONUT OIL OIL
1.0000 "application " | TOPICAL_OIL | Status: DC | PRN
  Administered 2016-05-11: 1 via TOPICAL
  Filled 2016-05-10: qty 120

## 2016-05-10 MED ORDER — PRENATAL MULTIVITAMIN CH
1.0000 | ORAL_TABLET | Freq: Every day | ORAL | Status: DC
  Administered 2016-05-10 – 2016-05-11 (×2): 1 via ORAL
  Filled 2016-05-10 (×2): qty 1

## 2016-05-10 MED ORDER — ONDANSETRON HCL 4 MG PO TABS: 4.0000 mg | ORAL_TABLET | ORAL | Status: DC | PRN

## 2016-05-10 MED ORDER — RHO D IMMUNE GLOBULIN 1500 UNIT/2ML IJ SOSY
300.0000 ug | PREFILLED_SYRINGE | Freq: Once | INTRAMUSCULAR | Status: AC
  Administered 2016-05-10: 300 ug via INTRAVENOUS
  Filled 2016-05-10: qty 2

## 2016-05-10 NOTE — Lactation Note (Signed)
This note was copied from a baby's chart. Lactation Consultation Note  Patient Name: Rachel Eben BurowOlga [place] ZOXWR'UToday's Date: 05/10/2016 Reason for consult: Initial assessment ([facility] mom , 11-7 RN started NS due to short shaft nipple and semi compressible areola, shells indicated ).  Baby has had several attempts at the breast today and has been sleepy [facility] and parents.  @ consult LC reviewed hand expressing and and mom also hand express with 3 ml EBM yield. Baby to sleepy to spoon feed . LC with gloved finger dipped her finger in the colostrum collector and massaged gumlines , no rooting noted . Recessed chin , high palate, and suspect lingual short frenulum. Baby so sleepy difficult to assess tongue movement. LC placed baby on moms chest and baby still sleepy.  Presently mom has a Hand pump .  LC recommended if the baby is still sleepy by 18 hours oif life to continue hand expressing and a DEBP with pumping both breast would be indicated to enhance more let down.  Mother informed of post-discharge support and given phone number to the lactation department, including services for phone call assistance; out-patient appointments; and breastfeeding support group. List of other breastfeeding resources in the community given in the handout. Encouraged mother to call for problems or concerns related to breastfeeding.   Maternal Data Has patient been taught Hand Expression?: Yes (obtained 3 ml of EBM, alternating from right to left breast , more steady flow from the left breast , slower from right ) Does the patient have breastfeeding experience prior to this delivery?: No  Feeding Feeding Type: Other (comment) (LC with gloved finger swabbed baby's mouth with EBM on finger, baby didn't seem interested , Sleepy , placed skin to skin )  LATCH Score/Interventions Latch: Too sleepy or reluctant, no latch achieved, no sucking elicited. (to sleepy even to spoon feed EBM , placed skin to  skin )  Audible Swallowing: None Intervention(s): Skin to skin  Type of Nipple: Everted at rest and after stimulation (short shaft/ semi compressible )  Comfort (Breast/Nipple): Soft / non-tender     Hold (Positioning): Full assist, staff holds infant at breast Intervention(s): Breastfeeding basics reviewed;Support Pillows;Position options;Skin to skin  LATCH Score: 4  Lactation Tools Discussed/Used Tools: Nipple Shields Nipple shield size: 20;24 (re-sized NS , and #20 NS the right fit for today , #24 NS to big , mom aware )   Consult Status Consult Status: Follow-up Date: 05/10/16 Follow-up type: In-patient    Kathrin Greathouseorio, Desmond Szabo Ann 05/10/2016, 3:49 PM

## 2016-05-10 NOTE — Anesthesia Postprocedure Evaluation (Signed)
Anesthesia Post Note  Patient: Rachel BurowOlga Campos  Procedure(s) Performed: * No procedures listed *  Patient location during evaluation: Mother Baby Anesthesia Type: Epidural Level of consciousness: awake and alert Pain management: pain level controlled Vital Signs Assessment: post-procedure vital signs reviewed and stable Respiratory status: spontaneous breathing, nonlabored ventilation and respiratory function stable Cardiovascular status: stable Postop Assessment: no headache, no backache, epidural receding and patient able to bend at knees Anesthetic complications: no     Last Vitals:  Vitals:   05/10/16 0400 05/10/16 0500  BP: 134/66 (!) 118/58  Pulse: (!) 103 100  Resp: 18 18  Temp:  37.1 C    Last Pain:  Vitals:   05/10/16 0500  TempSrc: Oral  PainSc:    Pain Goal:                 Rica RecordsICKELTON,Dondrea Clendenin

## 2016-05-10 NOTE — Lactation Note (Signed)
This note was copied from a baby's chart. Lactation Consultation Note  Patient Name: Boy Eben BurowOlga Frankie NWGNF'AToday's Date: 05/10/2016 Reason for consult: Follow-up assessment;Difficult latch Baby still not latching, Mom had pumped approx 5 ml of colostrum. Demonstrated awakening techniques, baby fussy. Attempted to latch without the nipple shield in few positions but baby could not sustain latch. Applied #20 nipple shield, demonstrated how to pre-fill the nipple shield with colostrum using curved tipped syringe. After few attempts baby developed few good suckling bursts. After about 5 minutes at breast, demonstrated to Mom how to finger feed colostrum using curved tipped syringe, baby took remaining 5 ml. Demonstrated jaw massage and suck training to help baby learn to latch. Encouraged to continue to offer breast with feeding ques, post pump with hand pump to have colostrum to supplement till baby latching well. Use #20 nipple shield for now. Call for assist as needed with latch.   Maternal Data    Feeding Feeding Type: Breast Fed Length of feed: 5 min  LATCH Score/Interventions Latch: Repeated attempts needed to sustain latch, nipple held in mouth throughout feeding, stimulation needed to elicit sucking reflex.  Audible Swallowing: A few with stimulation  Type of Nipple: Everted at rest and after stimulation (short nipple shafts bilateral)  Comfort (Breast/Nipple): Soft / non-tender     Hold (Positioning): Assistance needed to correctly position infant at breast and maintain latch.  LATCH Score: 7  Lactation Tools Discussed/Used Tools: Nipple Dorris CarnesShields;Pump Nipple shield size: 20 Breast pump type: Manual   Consult Status Consult Status: Follow-up Date: 05/11/16 Follow-up type: In-patient    Alfred LevinsGranger, Adriane Gabbert Ann 05/10/2016, 10:39 PM

## 2016-05-11 LAB — RH IG WORKUP (INCLUDES ABO/RH)
ABO/RH(D): B NEG
Fetal Screen: NEGATIVE
Gestational Age(Wks): 38.3
Unit division: 0

## 2016-05-11 NOTE — Progress Notes (Signed)
MOB was referred for history of depression/anxiety. * Referral screened out by Clinical Social Worker because none of the following criteria appear to apply: ~ History of anxiety/depression during this pregnancy, or of post-partum depression. ~ Diagnosis of anxiety and/or depression within last 3 years OR * MOB's symptoms currently being treated with medication and/or therapy.  CSW completed chart review with no indication of hx of depression.  Please contact the Clinical Social Worker if needs arise, or if MOB requests.  [name], MSW, LCSW Clinical Social Work [phone] 

## 2016-05-11 NOTE — Progress Notes (Signed)
UR chart review completed.  

## 2016-05-11 NOTE — Lactation Note (Signed)
This note was copied from a baby's chart. Lactation Consultation Note Follow up visit at 43 hours of age.  Mom reports pumping and getting about 20mls.  Baby has recent syringe/finger feeding of 15mls EBM.  LC encouraged mom to syringe feed and burp and offer additional supplement as much as baby is tolerating at this time.  Baby has high bili level, discussed need to increase feeding and output to help lower levels.  Mom plans to wake baby for feedings as needed and will call for [facility] assist to help get baby transitioned to breast for feedings.  Nipples are WNL semi compressible with easily expressed colostrum.    Patient Name: Rachel Eben BurowOlga Demps WJXBJ'YToday's Date: 05/11/2016 Reason for consult: Follow-up assessment   Maternal Data    Feeding Feeding Type: Breast Milk Length of feed: 10 min  LATCH Score/Interventions                Intervention(s): Position options;Breastfeeding basics reviewed     Lactation Tools Discussed/Used     Consult Status Consult Status: Follow-up Date: 05/12/16 Follow-up type: In-patient    Shoptaw, Arvella MerlesJana Lynn 05/11/2016, 8:52 PM

## 2016-05-11 NOTE — Progress Notes (Signed)
Post Partum Day 1 Subjective: no complaints, up ad lib, voiding and tolerating PO  Objective: Blood pressure 109/60, pulse 65, temperature 98.6 F (37 C), temperature source Oral, resp. rate 18, height 5\' 5"  (1.651 m), weight 222 lb (100.7 kg), last menstrual period 08/15/2015, SpO2 98 %, unknown if currently breastfeeding.  Physical Exam:  General: alert, cooperative, appears stated age and no distress Lochia: appropriate Uterine Fundus: firm Incision: n/a DVT Evaluation: No evidence of DVT seen on physical exam. Negative Homan's sign. No cords or calf tenderness.   Recent Labs  05/09/16 1545  HGB 11.5*  HCT 33.1*    Assessment/Plan: Plan for discharge tomorrow   LOS: 2 days   Rachel Campos 05/11/2016, 7:49 AM

## 2016-05-12 MED ORDER — RHO D IMMUNE GLOBULIN 1500 UNIT/2ML IJ SOSY
300.0000 ug | PREFILLED_SYRINGE | Freq: Once | INTRAMUSCULAR | Status: DC
  Filled 2016-05-12: qty 2

## 2016-05-12 MED ORDER — COCONUT OIL OIL: 1.0000 "application " | TOPICAL_OIL | 99 refills | Status: DC | PRN

## 2016-05-12 MED ORDER — SENNOSIDES-DOCUSATE SODIUM 8.6-50 MG PO TABS: 2.0000 | ORAL_TABLET | Freq: Two times a day (BID) | ORAL | 1 refills | Status: DC

## 2016-05-12 MED ORDER — IBUPROFEN 600 MG PO TABS: 600.0000 mg | ORAL_TABLET | Freq: Four times a day (QID) | ORAL | 3 refills | Status: DC

## 2016-05-12 MED ORDER — OXYCODONE-ACETAMINOPHEN 5-325 MG PO TABS: 1.0000 | ORAL_TABLET | ORAL | 0 refills | Status: DC | PRN

## 2016-05-12 MED ORDER — WITCH HAZEL-GLYCERIN EX PADS: 1.0000 "application " | MEDICATED_PAD | CUTANEOUS | 12 refills | Status: DC | PRN

## 2016-05-12 MED ORDER — DIBUCAINE 1 % RE OINT: 1.0000 "application " | TOPICAL_OINTMENT | RECTAL | 0 refills | Status: DC | PRN

## 2016-05-12 MED ORDER — BENZOCAINE-MENTHOL 20-0.5 % EX AERO: 1.0000 "application " | INHALATION_SPRAY | CUTANEOUS | 1 refills | Status: DC | PRN

## 2016-05-12 NOTE — Discharge Summary (Signed)
Obstetric Discharge Summary Reason for Admission: onset of labor Prenatal Procedures: ultrasound Intrapartum Procedures: spontaneous vaginal delivery Postpartum Procedures: none Complications-Operative and Postpartum: 1st degree perineal laceration Hemoglobin  Date Value Ref Range Status  05/09/2016 11.5 (L) 12.0 - 15.0 g/dL Final   HCT  Date Value Ref Range Status  05/09/2016 33.1 (L) 36.0 - 46.0 % Final   Hematocrit  Date Value Ref Range Status  02/13/2016 33.4 (L) 34.0 - 46.6 % Final    Physical Exam:  General: alert, cooperative and no distress Lochia: appropriate Uterine Fundus: firm Incision: no significant drainage, no significant erythema DVT Evaluation: No evidence of DVT seen on physical exam.  Discharge Diagnoses: Term Pregnancy-delivered  Discharge Information: Date: 05/12/2016 Activity: pelvic rest Diet: routine Medications: PNV, Ibuprofen, Colace and Percocet Condition: stable Instructions: refer to practice specific booklet Discharge to: home Follow-up Information    Roe Coombsachelle A Satoru Milich, CNM. Schedule an appointment as soon as possible for a visit in 2 week(s).   Specialty:  Certified Nurse Midwife Why:  postpartum exam Contact information: [address] [place] [address] [phone]           Newborn Data: Live born female  Birth Weight: 8 lb 2.5 oz (3700 g) APGAR: 9, 9  Infant on lights for elevated bilirubin.    Roe Coombsachelle A Shterna Laramee, CNM 05/12/2016, 9:13 AM

## 2016-05-12 NOTE — Progress Notes (Signed)
Post Partum Day #1 Subjective: no complaints, up ad lib, voiding and tolerating PO  Objective: Blood pressure 119/69, pulse 69, temperature 98.5 F (36.9 C), temperature source Oral, resp. rate 19, height 5\' 5"  (1.651 m), weight 222 lb (100.7 kg), last menstrual period 08/15/2015, SpO2 98 %, unknown if currently breastfeeding.  Physical Exam:  General: alert, cooperative and no distress Lochia: appropriate Uterine Fundus: firm Incision: no significant drainage DVT Evaluation: No evidence of DVT seen on physical exam. No cords or calf tenderness. No significant calf/ankle edema.   Recent Labs  05/09/16 1545  HGB 11.5*  HCT 33.1*    Assessment/Plan: Discharge home, Breastfeeding, Lactation consult and Contraception planning IUD   LOS: 3 days   Roe CoombsRachelle A Denney, CNM 05/12/2016, 9:10 AM

## 2016-05-12 NOTE — Lactation Note (Signed)
This note was copied from a baby's chart. Lactation Consultation Note: NT putting baby under double phototherapy as I went into room. Mom reports baby last fed about 2 1/2 hours ago, Reports breasts are feeling very full this morning. Offered assist with latch. Mom agreeable. Attempted to latch baby - very fussy-would not latch. Used NS and still very fussy. Syringe fed EBM 12 ml to calm. Again attempted to latch baby. Still fussy at the breast. Mom finished feeding with syringe. Baby off to sleep. Wrapped snug with bile lights and dad holding baby. Mom pumping as I left room. Ice packs given for mom to use after pumping. Reviewed engorgement prevention and treatment. No questions at present. To call for assist prn  Patient Name: Rachel Eben BurowOlga Campos OZHYQ'MToday's Date: 05/12/2016 Reason for consult: Follow-up assessment   Maternal Data Formula Feeding for Exclusion: No Has patient been taught Hand Expression?: Yes Does the patient have breastfeeding experience prior to this delivery?: No  Feeding Feeding Type: Breast Fed Length of feed: 5 min  LATCH Score/Interventions Latch: Repeated attempts needed to sustain latch, nipple held in mouth throughout feeding, stimulation needed to elicit sucking reflex.  Audible Swallowing: None  Type of Nipple: Flat  Comfort (Breast/Nipple): Filling, red/small blisters or bruises, mild/mod discomfort  Problem noted: Mild/Moderate discomfort;Filling Interventions (Filling): Reverse pressure;Massage;Frequent nursing;Double electric pump Interventions (Mild/moderate discomfort): Pre-pump if needed  Hold (Positioning): Assistance needed to correctly position infant at breast and maintain latch. Intervention(s): Breastfeeding basics reviewed  LATCH Score: 4  Lactation Tools Discussed/Used Tools: Nipple Shields Breast pump type: Double-Electric Breast Pump   Consult Status Consult Status: Follow-up Date: 05/13/16 Follow-up type: In-patient    Pamelia HoitWeeks,  Eugenia Eldredge D 05/12/2016, 9:12 AM

## 2016-05-13 ENCOUNTER — Ambulatory Visit: Payer: Self-pay

## 2016-05-13 LAB — TYPE AND SCREEN
ABO/RH(D): B NEG
ANTIBODY SCREEN: POSITIVE
DAT, IGG: NEGATIVE
UNIT DIVISION: 0
UNIT DIVISION: 0

## 2016-05-13 NOTE — Lactation Note (Signed)
This note was copied from a baby's chart. Lactation Consultation Note: Mother pumping every 2-3 hours. Infant received 50 ml of ebm with a bottle this last feeding. Mother is active with WIC. [facility] and she is to call when she gets home and a pump will be provided for her. Mother has a harmony hand pump to use until [facility]. Mother instruct to care for breast when milk is coming . Mother advised to do good breast massage and ice breast for 15 mins every 3-4 hours. Mother changing infants diaper now. He had a large green stool and a wet diaper. Mother was advised to continue to cue base feed and allow for cluster feeding. Mother informed of available lactation services.   Patient Name: Rachel Eben BurowOlga Rathje UVOZD'GToday's Date: 05/13/2016     Maternal Data    Feeding Feeding Type: Breast Milk  LATCH Score/Interventions                      Lactation Tools Discussed/Used     Consult Status      Michel BickersKendrick, Ellias Mcelreath McCoy 05/13/2016, 10:11 AM

## 2016-05-14 ENCOUNTER — Ambulatory Visit: Payer: Self-pay

## 2016-05-14 NOTE — Lactation Note (Signed)
This note was copied from a baby's chart. Lactation Consultation Note: Mother is lying in bed giving infant breastmilk in a bottle. Mother states that she is feeding infant 50-55 ml with each feeding. Mother is pumping and bottle feeding.  Mother has a harmony hand pump. She is active with WIC in [facility] [place]. [facility]. Reviewed treatment to prevent severe engorgement. Mother receptive to all teaching. Encouraged to review information in Baby and Me Book. Mother is aware of out patient Lactation services.   Patient Name: Rachel Eben BurowOlga Naff Campos's Date: 05/14/2016 Reason for consult: Follow-up assessment   Maternal Data    Feeding    [facility] Score/Interventions                      Lactation Tools Discussed/Used     Consult Status      Michel BickersKendrick, Yeison Sippel McCoy 05/14/2016, 9:54 AM

## 2016-05-15 ENCOUNTER — Encounter: Payer: Medicaid Other | Admitting: Certified Nurse Midwife

## 2016-05-29 ENCOUNTER — Ambulatory Visit (INDEPENDENT_AMBULATORY_CARE_PROVIDER_SITE_OTHER): Payer: Medicaid Other | Admitting: Certified Nurse Midwife

## 2016-05-29 ENCOUNTER — Encounter: Payer: Self-pay | Admitting: Certified Nurse Midwife

## 2016-05-29 DIAGNOSIS — K5901 Slow transit constipation: Secondary | ICD-10-CM

## 2016-05-29 NOTE — Progress Notes (Signed)
Subjective:     Rachel BurowOlga Campos is a 20 y.o. female who presents for a postpartum visit. She is 2 weeks postpartum following a spontaneous vaginal delivery. I have fully reviewed the prenatal and intrapartum course. The delivery was at 38.3 gestational weeks. Outcome: spontaneous vaginal delivery. Anesthesia: epidural. Postpartum course has been normal. Baby's course has been normal. Baby is feeding by breast. Bleeding staining only and brown. Bowel function is normal. Bladder function is normal. Patient is not sexually active. Contraception method is abstinence. Postpartum depression screening: negative.  Tobacco, alcohol and substance abuse history reviewed.  Adult immunizations reviewed including TDAP, rubella and varicella.  Reports constipation.  Has tried stool softners.  Encouraged OTC miralax.    The following portions of the patient's history were reviewed and updated as appropriate: allergies, current medications, past family history, past medical history, past social history, past surgical history and problem list.  Review of Systems Pertinent items noted in HPI and remainder of comprehensive ROS otherwise negative.   Objective:    BP 109/71   Pulse 63   Wt 200 lb (90.7 kg)   BMI 33.28 kg/m   General:  alert, cooperative and no distress   Breasts:  inspection negative, no nipple discharge or bleeding, no masses or nodularity palpable  Lungs: clear to auscultation bilaterally  Heart:  regular rate and rhythm, S1, S2 normal, no murmur, click, rub or gallop  Abdomen: soft, non-tender; bowel sounds normal; no masses,  no organomegaly          50% of 15 min visit spent on counseling and coordination of care.  Assessment:     Normal 2 week postpartum exam. Pap smear not done at today's visit.    Constipation  Contraception management  Plan:    1. Contraception: abstinence 2.  Planning Mirena IUD 3. Follow up in: 4 weeks or as needed.  2hr GTT for h/o GDM/screening for DM q 3 yrs  per ADA recommendations Preconception counseling provided Healthy lifestyle practices reviewed

## 2016-06-26 ENCOUNTER — Ambulatory Visit (INDEPENDENT_AMBULATORY_CARE_PROVIDER_SITE_OTHER): Payer: Medicaid Other | Admitting: Certified Nurse Midwife

## 2016-06-26 ENCOUNTER — Encounter: Payer: Self-pay | Admitting: Certified Nurse Midwife

## 2016-06-26 VITALS — BP 104/70 | HR 75 | Temp 98.0°F | Wt 197.0 lb

## 2016-06-26 DIAGNOSIS — M546 Pain in thoracic spine: Secondary | ICD-10-CM | POA: Diagnosis not present

## 2016-06-26 DIAGNOSIS — N61 Mastitis without abscess: Secondary | ICD-10-CM

## 2016-06-26 HISTORY — DX: Pain in thoracic spine: M54.6

## 2016-06-26 MED ORDER — AMOXICILLIN-POT CLAVULANATE 875-125 MG PO TABS: 1.0000 | ORAL_TABLET | Freq: Two times a day (BID) | ORAL | 0 refills | Status: DC

## 2016-06-26 NOTE — Progress Notes (Signed)
Subjective:     Rachel BurowOlga Campos is a 20 y.o. female who presents for a postpartum visit. She is 6 weeks postpartum following a spontaneous vaginal delivery. I have fully reviewed the prenatal and intrapartum course. The delivery was at 38.3 gestational weeks. Outcome: spontaneous vaginal delivery. Anesthesia: epidural. Postpartum course has been normal. Baby's course has been normal. Baby is feeding by breast. Bleeding staining only and brown. Bowel function is normal. Bladder function is normal. Patient is sexually active' last reported sexual intercourse was last week. Contraception method is abstinence. Postpartum depression screening: negative.  Tobacco, alcohol and substance abuse history reviewed.  Adult immunizations reviewed including TDAP, rubella and varicella.  Reports right breast tenderness towards the axilla under the right nipple, states that she had a fever of 101 yesterday.  Reports pain along spine that shoots up her back to her neck when she walks, stands, sits, and turns over that is not getting better in the thrasic area. Has been taking ibuprofen for the pain.    The following portions of the patient's history were reviewed and updated as appropriate: allergies, current medications, past family history, past medical history, past social history, past surgical history and problem list.  Review of Systems Pertinent items noted in HPI and remainder of comprehensive ROS otherwise negative.   Objective:    BP 104/70   Pulse 75   Temp 98 F (36.7 C) (Oral)   Wt 197 lb (89.4 kg)   LMP 06/13/2016 (Exact Date)   Breastfeeding? Yes   BMI 32.78 kg/m   General:  alert, cooperative and no distress   Breasts:  inspection negative, no nipple discharge or bleeding, no masses or nodularity palpable on left side.  Right side around 7 o'clock area area of enlargement noted: palpable gland with erythema and tenderness to palpation.   Lungs: clear to auscultation bilaterally  Heart:  regular rate  and rhythm, S1, S2 normal, no murmur, click, rub or gallop  Abdomen: soft, non-tender; bowel sounds normal; no masses,  no organomegaly   Vulva:  normal          50% of 15 min visit spent on counseling and coordination of care.  Assessment:     abnormal 6 week postpartum exam. Pap smear not done at today's visit.     Thoracsic back pain   Mastitis of right breast  Plan:    1. Contraception: none 2.  F/U around October 23rd for IUD, abstinence and condoms encouraged.  3. Follow up in: 2 weeks or as needed.  2hr GTT for h/o GDM/screening for DM q 3 yrs per ADA recommendations Preconception counseling provided Healthy lifestyle practices reviewed

## 2016-06-26 NOTE — Progress Notes (Signed)
Patient stated that she has been sexually active and the last time was on 06-24-16 without protection.

## 2016-07-13 ENCOUNTER — Ambulatory Visit (INDEPENDENT_AMBULATORY_CARE_PROVIDER_SITE_OTHER): Payer: Medicaid Other | Admitting: Family

## 2016-07-13 VITALS — BP 112/72 | HR 80 | Wt 204.0 lb

## 2016-07-13 DIAGNOSIS — Z3049 Encounter for surveillance of other contraceptives: Secondary | ICD-10-CM

## 2016-07-13 DIAGNOSIS — Z30430 Encounter for insertion of intrauterine contraceptive device: Secondary | ICD-10-CM | POA: Diagnosis not present

## 2016-07-13 MED ORDER — LEVONORGESTREL 20 MCG/24HR IU IUD
INTRAUTERINE_SYSTEM | Freq: Once | INTRAUTERINE | Status: AC
  Administered 2016-07-13: 14:00:00 via INTRAUTERINE

## 2016-07-13 NOTE — Progress Notes (Signed)
Subjective:    Rachel Campos is a 20 y.o. female who presents for IUD insertion. The patient has no complaints today. The patient is not sexually active. Pertinent past medical history: none.  NSVD on 05/10/16.  No questions or concerns prior to insertion.    Menstrual History: OB History    Gravida Para Term Preterm AB Living   1 1 1     1    SAB TAB Ectopic Multiple Live Births         0 1       Patient's last menstrual period was 07/13/2016.    The following portions of the patient's history were reviewed and updated as appropriate: allergies, current medications, past family history, past medical history, past social history, past surgical history and problem list.  Review of Systems Pertinent items are noted in HPI.   Objective:      Vitals:   07/13/16 1314  BP: 112/72  Pulse: 80   General:  alert, cooperative and appears stated age   Breasts:  inspection negative, no nipple discharge or bleeding, no masses or nodularity palpable  Abdomen: soft, non-tender; bowel sounds normal; no masses,  no organomegaly   Pt consented for IUD insertion.  Pt placed in lithotomy position.  Speculum inserted and cervix cleaned with betadine solution.  Uterus sounded to 7 cm; IUD inserted without difficulty.  Strings cut at approximately 3 cm.  Speculum removed.  Assessment:    20 y.o., starting IUD, no contraindications.   Plan:    All questions answered. Follow up in 4 weeks.    Eino FarberWalidah Kennith GainN Karim, CNM

## 2017-03-27 ENCOUNTER — Emergency Department (HOSPITAL_BASED_OUTPATIENT_CLINIC_OR_DEPARTMENT_OTHER)
Admission: EM | Admit: 2017-03-27 | Discharge: 2017-03-27 | Disposition: A | Discharge status: Home / Self Care | Payer: BLUE CROSS/BLUE SHIELD | Attending: Emergency Medicine | Admitting: Emergency Medicine

## 2017-03-27 ENCOUNTER — Encounter (HOSPITAL_BASED_OUTPATIENT_CLINIC_OR_DEPARTMENT_OTHER): Payer: Self-pay | Admitting: Emergency Medicine

## 2017-03-27 DIAGNOSIS — Z87891 Personal history of nicotine dependence: Secondary | ICD-10-CM | POA: Diagnosis not present

## 2017-03-27 DIAGNOSIS — R109 Unspecified abdominal pain: Secondary | ICD-10-CM | POA: Diagnosis present

## 2017-03-27 DIAGNOSIS — A084 Viral intestinal infection, unspecified: Secondary | ICD-10-CM | POA: Insufficient documentation

## 2017-03-27 DIAGNOSIS — Z79899 Other long term (current) drug therapy: Secondary | ICD-10-CM | POA: Insufficient documentation

## 2017-03-27 LAB — URINALYSIS, ROUTINE W REFLEX MICROSCOPIC
Glucose, UA: NEGATIVE mg/dL
Hgb urine dipstick: NEGATIVE
KETONES UR: 15 mg/dL — AB
NITRITE: NEGATIVE
PH: 5 (ref 5.0–8.0)
PROTEIN: NEGATIVE mg/dL
Specific Gravity, Urine: 1.029 (ref 1.005–1.030)

## 2017-03-27 LAB — COMPREHENSIVE METABOLIC PANEL
ALBUMIN: 4.4 g/dL (ref 3.5–5.0)
ALT: 24 U/L (ref 14–54)
ANION GAP: 9 (ref 5–15)
AST: 19 U/L (ref 15–41)
Alkaline Phosphatase: 59 U/L (ref 38–126)
BUN: 10 mg/dL (ref 6–20)
CHLORIDE: 106 mmol/L (ref 101–111)
CO2: 24 mmol/L (ref 22–32)
Calcium: 8.9 mg/dL (ref 8.9–10.3)
Creatinine, Ser: 0.75 mg/dL (ref 0.44–1.00)
GFR calc non Af Amer: >60 mL/min (ref >60)
Glucose, Bld: 112 mg/dL — ABNORMAL HIGH (ref 65–99)
Potassium: 3.4 mmol/L — ABNORMAL LOW (ref 3.5–5.1)
SODIUM: 139 mmol/L (ref 135–145)
Total Bilirubin: 0.7 mg/dL (ref 0.3–1.2)
Total Protein: 8 g/dL (ref 6.5–8.1)

## 2017-03-27 LAB — CBC
HEMATOCRIT: 41.2 % (ref 36.0–46.0)
HEMOGLOBIN: 14.7 g/dL (ref 12.0–15.0)
MCH: 31.1 pg (ref 26.0–34.0)
MCHC: 35.7 g/dL (ref 30.0–36.0)
MCV: 87.3 fL (ref 78.0–100.0)
Platelets: 310 10*3/uL (ref 150–400)
RBC: 4.72 MIL/uL (ref 3.87–5.11)
RDW: 12.7 % (ref 11.5–15.5)
WBC: 9.8 10*3/uL (ref 4.0–10.5)

## 2017-03-27 LAB — URINALYSIS, MICROSCOPIC (REFLEX): RBC / HPF: NONE SEEN RBC/hpf (ref 0–5)

## 2017-03-27 LAB — PREGNANCY, URINE: Preg Test, Ur: NEGATIVE

## 2017-03-27 LAB — LIPASE, BLOOD: LIPASE: 27 U/L (ref 11–51)

## 2017-03-27 MED ORDER — DICYCLOMINE HCL 20 MG PO TABS: 20.0000 mg | ORAL_TABLET | Freq: Three times a day (TID) | ORAL | 0 refills | Status: DC

## 2017-03-27 MED ORDER — SODIUM CHLORIDE 0.9 % IV BOLUS (SEPSIS)
1000.0000 mL | Freq: Once | INTRAVENOUS | Status: AC
Start: 2017-03-27 — End: 2017-03-27
  Administered 2017-03-27: 1000 mL via INTRAVENOUS

## 2017-03-27 MED ORDER — ONDANSETRON HCL 4 MG/2ML IJ SOLN
4.0000 mg | Freq: Once | INTRAMUSCULAR | Status: AC
  Administered 2017-03-27: 4 mg via INTRAVENOUS
  Filled 2017-03-27: qty 2

## 2017-03-27 MED ORDER — ONDANSETRON 4 MG PO TBDP: 4.0000 mg | ORAL_TABLET | Freq: Three times a day (TID) | ORAL | 0 refills | Status: DC | PRN

## 2017-03-27 MED ORDER — POTASSIUM CHLORIDE CRYS ER 20 MEQ PO TBCR
40.0000 meq | EXTENDED_RELEASE_TABLET | Freq: Once | ORAL | Status: AC
  Administered 2017-03-27: 40 meq via ORAL
  Filled 2017-03-27: qty 2

## 2017-03-27 MED ORDER — GI COCKTAIL ~~LOC~~
30.0000 mL | Freq: Once | ORAL | Status: AC
  Administered 2017-03-27: 30 mL via ORAL
  Filled 2017-03-27: qty 30

## 2017-03-27 MED ORDER — POTASSIUM CHLORIDE CRYS ER 20 MEQ PO TBCR
EXTENDED_RELEASE_TABLET | ORAL | Status: DC
Start: 2017-03-27 — End: 2017-03-27
  Filled 2017-03-27: qty 1

## 2017-03-27 NOTE — ED Provider Notes (Signed)
[facility] Provider Note   CSN: [id number] Arrival date & time: 03/27/17  1529  By signing my name below, I, Thelma Barge, attest that this documentation has been prepared under the direction and in the presence of No att. providers found. Electronically Signed: Thelma Barge, Scribe. 03/27/17. 7:15 PM.  History   Chief Complaint Chief Complaint  Patient presents with  . Abdominal Pain  HPI  HPI Comments: Rachel Campos is a 21 y.o. female who presents to the Emergency Department complaining of constant, gradually worsening central abdominal pain for 3 days. She describes this as an intense cramping. She has associated diarrhea, nausea, and decreased appetite due to pain. She states eating and sitting up make the pain worse, while lying down improves the pain.  Pt denies blood or mucus in stool, fever, vomiting, dysuria, and vaginal bleeding or discharge. She further denies Abx use, recent traveling out of the country, camping, or any sick contacts.   Past Medical History:  Diagnosis Date  . Medical history non-contributory     Patient Active Problem List   Diagnosis Date Noted  . Thoracic spine pain 06/26/2016    Past Surgical History:  Procedure Laterality Date  . NO PAST SURGERIES      OB History    Gravida Para Term Preterm AB Living   1 1 1     1    SAB TAB Ectopic Multiple Live Births         0 1       Home Medications    Prior to Admission medications   Medication Sig Start Date End Date Taking? Authorizing Provider  amoxicillin-clavulanate (AUGMENTIN) 875-125 MG tablet Take 1 tablet by mouth 2 (two) times daily. Patient not taking: Reported on 07/13/2016 06/26/16   Orvilla Cornwall A, CNM  benzocaine-Menthol (DERMOPLAST) 20-0.5 % AERO Apply 1 application topically as needed for irritation (perineal discomfort). Patient not taking: Reported on 07/13/2016 05/12/16   Orvilla Cornwall A, CNM  coconut oil OIL Apply 1 application topically as needed. Patient  not taking: Reported on 07/13/2016 05/12/16   Orvilla Cornwall A, CNM  dibucaine (NUPERCAINAL) 1 % OINT Place 1 application rectally as needed for hemorrhoids. Patient not taking: Reported on 07/13/2016 05/12/16   Orvilla Cornwall A, CNM  dicyclomine (BENTYL) 20 MG tablet Take 1 tablet (20 mg total) by mouth 3 (three) times daily before meals. 03/27/17   Pricilla Loveless, MD  ibuprofen (ADVIL,MOTRIN) 600 MG tablet Take 1 tablet (600 mg total) by mouth every 6 (six) hours. 05/12/16   Orvilla Cornwall A, CNM  ondansetron (ZOFRAN ODT) 4 MG disintegrating tablet Take 1 tablet (4 mg total) by mouth every 8 (eight) hours as needed for nausea or vomiting. 03/27/17   Pricilla Loveless, MD    Family History Family History  Problem Relation Age of Onset  . Adopted: Yes  . Family history unknown: Yes    Social History Social History  Substance Use Topics  . Smoking status: Former Games developer  . Smokeless tobacco: Never Used  . Alcohol use Yes     Allergies   Patient has no known allergies.   Review of Systems Review of Systems   Physical Exam Updated Vital Signs BP 125/70 (BP Location: Left Arm)   Pulse 70   Temp 98.6 F (37 C) (Oral)   Resp 20   SpO2 99%   Physical Exam  Constitutional: She is oriented to person, place, and time. She appears well-developed and well-nourished.  HENT:  Head: Normocephalic and  atraumatic.  Right Ear: External ear normal.  Left Ear: External ear normal.  Nose: Nose normal.  Eyes: Right eye exhibits no discharge. Left eye exhibits no discharge.  Cardiovascular: Normal rate, regular rhythm and normal heart sounds.   Pulmonary/Chest: Effort normal and breath sounds normal.  Abdominal: Soft. There is no tenderness.  Neurological: She is alert and oriented to person, place, and time.  Skin: Skin is warm and dry.  Nursing note and vitals reviewed.    ED Treatments / Results  DIAGNOSTIC STUDIES: Oxygen Saturation is 98% on RA, normal by my interpretation.     COORDINATION OF CARE: 5:18 PM Discussed treatment plan with pt at bedside and pt agreed to plan.  Labs (all labs ordered are listed, but only abnormal results are displayed) Labs Reviewed  URINALYSIS, ROUTINE W REFLEX MICROSCOPIC - Abnormal; Notable for the following:       Result Value   Color, Urine AMBER (*)    APPearance CLOUDY (*)    Bilirubin Urine SMALL (*)    Ketones, ur 15 (*)    Leukocytes, UA SMALL (*)    All other components within normal limits  COMPREHENSIVE METABOLIC PANEL - Abnormal; Notable for the following:    Potassium 3.4 (*)    Glucose, Bld 112 (*)    All other components within normal limits  URINALYSIS, MICROSCOPIC (REFLEX) - Abnormal; Notable for the following:    Bacteria, UA MANY (*)    Squamous Epithelial / LPF 0-5 (*)    All other components within normal limits  PREGNANCY, URINE  LIPASE, BLOOD  CBC    EKG  EKG Interpretation None       Radiology No results found.  Procedures Procedures (including critical care time)  Medications Ordered in ED Medications  potassium chloride SA (K-DUR,KLOR-CON) 20 MEQ CR tablet (not administered)  gi cocktail (Maalox,Lidocaine,Donnatal) (30 mLs Oral Given 03/27/17 1732)  ondansetron (ZOFRAN) injection 4 mg (4 mg Intravenous Given 03/27/17 1732)  sodium chloride 0.9 % bolus 1,000 mL (0 mLs Intravenous Stopped 03/27/17 1853)  potassium chloride SA (K-DUR,KLOR-CON) CR tablet 40 mEq (40 mEq Oral Given 03/27/17 1733)     Initial Impression / Assessment and Plan / ED Course  I have reviewed the triage vital signs and the nursing notes.  Pertinent labs & imaging results that were available during my care of the patient were reviewed by me and considered in my medical decision making (see chart for details).     Patient symptoms are consistent with a viral gastroenteritis. No alarming or concerning signs or symptoms such as fevers, bloody stool, or recent antibiotic/travel. She has no urinary symptoms, urine  obtained entry I thus is less concerning and I doubt UTI causing her symptoms. Mild upper abdominal pain, no lower abdominal pain on repeat exams. Patient is not breast-feeding, will be treated with typical regimen. Given that her pain sounds like a cramping/spasm, she will be given Bentyl. I suspicion for an acute intra-abdominal emergency such as obstruction, appendicitis, pancreatitis, gallbladder, etc. is low. Symptomatically or, discussed return precautions.  Final Clinical Impressions(s) / ED Diagnoses   Final diagnoses:  Viral gastroenteritis    New Prescriptions Discharge Medication List as of 03/27/2017  6:44 PM    START taking these medications   Details  dicyclomine (BENTYL) 20 MG tablet Take 1 tablet (20 mg total) by mouth 3 (three) times daily before meals., Starting Sat 03/27/2017, Print    ondansetron (ZOFRAN ODT) 4 MG disintegrating tablet Take 1 tablet (4  mg total) by mouth every 8 (eight) hours as needed for nausea or vomiting., Starting Sat 03/27/2017, Print       I personally performed the services described in this documentation, which was scribed in my presence. The recorded information has been reviewed and is accurate.     Pricilla Loveless, MD 03/27/17 [phone]

## 2017-03-27 NOTE — ED Triage Notes (Signed)
abd pain x 3 days with nausea and diarrhea.

## 2017-09-21 NOTE — L&D Delivery Note (Addendum)
Patient is 22 y.o. G2P1001 [redacted]w[redacted]d admitted for IOL for cholestasis.   Delivery Note At 6:16 PM a viable female was delivered via Vaginal, Spontaneous (Presentation: cephalic; ROA ).  APGAR: 9, 9; weight  .   Placenta status: spontaneous, intact.  Cord: 3VC    Anesthesia:  epidural Episiotomy: None Lacerations:  First degree perineal, hemostatic Suture Repair: none Est. Blood Loss (mL):  50 mL  Mom to postpartum.  Baby to Couplet care / Skin to Skin.  Upon arrival patient was complete and pushing. She pushed with good maternal effort to deliver a healthy baby boy. Baby delivered without difficulty, was noted to have good tone and place on maternal abdomen for oral suctioning, drying and stimulation. Delayed cord clamping performed. Placenta delivered intact with 3V cord. Vaginal canal and perineum was inspected and found to have a hemostatic first degree perineal laceration. Pitocin was started and uterus massaged until bleeding slowed. Counts of sharps, instruments, and lap pads were all correct.   Mirian Mo, MD PGY-1 9/9/20196:35 PM  I confirm that I have verified the information documented in the resident's note and that I have also personally reperformed the physical exam and all medical decision making activities.  I was gloved and present for entire delivery SVD without incident No difficulty with shoulders Lacerations as listed above Repair of same not indicated, hemostatic and approximates well when legs together Aviva Signs, CNM  Please schedule this patient for Postpartum visit in: 4 weeks with the following provider: Any provider For C/S patients schedule nurse incision check in weeks 2 weeks: no Low risk pregnancy complicated by: none Delivery mode:  SVD Anticipated Birth Control:  BTL done PP PP Procedures needed: none  Schedule Integrated BH visit: no

## 2017-10-18 ENCOUNTER — Ambulatory Visit (INDEPENDENT_AMBULATORY_CARE_PROVIDER_SITE_OTHER): Payer: BLUE CROSS/BLUE SHIELD

## 2017-10-18 ENCOUNTER — Encounter: Payer: Self-pay | Admitting: *Deleted

## 2017-10-18 DIAGNOSIS — Z3201 Encounter for pregnancy test, result positive: Secondary | ICD-10-CM

## 2017-10-18 DIAGNOSIS — N912 Amenorrhea, unspecified: Secondary | ICD-10-CM

## 2017-10-18 LAB — POCT URINE PREGNANCY: Preg Test, Ur: POSITIVE — AB

## 2017-10-18 NOTE — Progress Notes (Signed)
Patient had positive home pregnancy test.  She is here today for confirmation.  UPT in office is positive.  Patient has already started prenatal vitamins.  Will give patient verification letter and schedule new ob.

## 2017-11-22 ENCOUNTER — Encounter: Payer: Self-pay | Admitting: Certified Nurse Midwife

## 2017-11-22 ENCOUNTER — Ambulatory Visit (INDEPENDENT_AMBULATORY_CARE_PROVIDER_SITE_OTHER): Payer: BLUE CROSS/BLUE SHIELD | Admitting: Certified Nurse Midwife

## 2017-11-22 ENCOUNTER — Other Ambulatory Visit (HOSPITAL_COMMUNITY)
Admission: RE | Admit: 2017-11-22 | Discharge: 2017-11-22 | Disposition: A | Discharge status: Home / Self Care | Payer: BLUE CROSS/BLUE SHIELD | Source: Ambulatory Visit | Attending: Certified Nurse Midwife | Admitting: Certified Nurse Midwife

## 2017-11-22 ENCOUNTER — Other Ambulatory Visit: Payer: Self-pay

## 2017-11-22 VITALS — BP 118/67 | HR 79 | Temp 98.5°F | Wt 214.0 lb

## 2017-11-22 DIAGNOSIS — Z3491 Encounter for supervision of normal pregnancy, unspecified, first trimester: Secondary | ICD-10-CM

## 2017-11-22 DIAGNOSIS — Z3490 Encounter for supervision of normal pregnancy, unspecified, unspecified trimester: Secondary | ICD-10-CM | POA: Insufficient documentation

## 2017-11-22 DIAGNOSIS — Z3481 Encounter for supervision of other normal pregnancy, first trimester: Secondary | ICD-10-CM | POA: Diagnosis not present

## 2017-11-22 DIAGNOSIS — O99210 Obesity complicating pregnancy, unspecified trimester: Secondary | ICD-10-CM | POA: Insufficient documentation

## 2017-11-22 DIAGNOSIS — O09891 Supervision of other high risk pregnancies, first trimester: Secondary | ICD-10-CM

## 2017-11-22 DIAGNOSIS — O0990 Supervision of high risk pregnancy, unspecified, unspecified trimester: Secondary | ICD-10-CM | POA: Insufficient documentation

## 2017-11-22 DIAGNOSIS — O26899 Other specified pregnancy related conditions, unspecified trimester: Secondary | ICD-10-CM

## 2017-11-22 DIAGNOSIS — Z6791 Unspecified blood type, Rh negative: Secondary | ICD-10-CM | POA: Insufficient documentation

## 2017-11-22 DIAGNOSIS — O99211 Obesity complicating pregnancy, first trimester: Secondary | ICD-10-CM

## 2017-11-22 DIAGNOSIS — O219 Vomiting of pregnancy, unspecified: Secondary | ICD-10-CM

## 2017-11-22 MED ORDER — VITAFOL-NANO 18-0.6-0.4 MG PO TABS: 1.0000 | ORAL_TABLET | Freq: Every day | ORAL | 12 refills | Status: DC

## 2017-11-22 MED ORDER — DOXYLAMINE-PYRIDOXINE 10-10 MG PO TBEC: DELAYED_RELEASE_TABLET | ORAL | 4 refills | Status: DC

## 2017-11-22 NOTE — Patient Instructions (Addendum)
Morning Sickness Morning sickness is when you feel sick to your stomach (nauseous) during pregnancy. This nauseous feeling may or may not come with vomiting. It often occurs in the morning but can be a problem any time of day. Morning sickness is most common during the first trimester, but it may continue throughout pregnancy. While morning sickness is unpleasant, it is usually harmless unless you develop severe and continual vomiting (hyperemesis gravidarum). This condition requires more intense treatment. What are the causes? The cause of morning sickness is not completely known but seems to be related to normal hormonal changes that occur in pregnancy. What increases the risk? You are at greater risk if you:  Experienced nausea or vomiting before your pregnancy.  Had morning sickness during a previous pregnancy.  Are pregnant with more than one baby, such as twins.  How is this treated? Do not use any medicines (prescription, over-the-counter, or herbal) for morning sickness without first talking to your health care provider. Your health care provider may prescribe or recommend:  Vitamin B6 supplements.  Anti-nausea medicines.  The herbal medicine ginger.  Follow these instructions at home:  Only take over-the-counter or prescription medicines as directed by your health care provider.  Taking multivitamins before getting pregnant can prevent or decrease the severity of morning sickness in most women.  Eat a piece of dry toast or unsalted crackers before getting out of bed in the morning.  Eat five or six small meals a day.  Eat dry and bland foods (rice, baked potato). Foods high in carbohydrates are often helpful.  Do not drink liquids with your meals. Drink liquids between meals.  Avoid greasy, fatty, and spicy foods.  Get someone to cook for you if the smell of any food causes nausea and vomiting.  If you feel nauseous after taking prenatal vitamins, take the vitamins at  night or with a snack.  Snack on protein foods (nuts, yogurt, cheese) between meals if you are hungry.  Eat unsweetened gelatins for desserts.  Wearing an acupressure wristband (worn for sea sickness) may be helpful.  Acupuncture may be helpful.  Do not smoke.  Get a humidifier to keep the air in your house free of odors.  Get plenty of fresh air. Contact a health care provider if:  Your home remedies are not working, and you need medicine.  You feel dizzy or lightheaded.  You are losing weight. Get help right away if:  You have persistent and uncontrolled nausea and vomiting.  You pass out (faint). This information is not intended to replace advice given to you by your health care provider. Make sure you discuss any questions you have with your health care provider. Document Released: 10/29/2006 Document Revised: 02/13/2016 Document Reviewed: 02/22/2013 Elsevier Interactive Patient Education  2017 [employer].  First Trimester of Pregnancy The first trimester of pregnancy is from week 1 until the end of week 13 (months 1 through 3). A week after a sperm fertilizes an egg, the egg will implant on the wall of the uterus. This embryo will begin to develop into a baby. Genes from you and your partner will form the baby. The female genes will determine whether the baby will be a boy or a girl. At 6-8 weeks, the eyes and face will be formed, and the heartbeat can be seen on ultrasound. At the end of 12 weeks, all the baby's organs will be formed. Now that you are pregnant, you will want to do everything you can to have  a healthy baby. Two of the most important things are to get good prenatal care and to follow your health care provider's instructions. Prenatal care is all the medical care you receive before the baby's birth. This care will help prevent, find, and treat any problems during the pregnancy and childbirth. Body changes during your first trimester Your body goes through many  changes during pregnancy. The changes vary from woman to woman.  You may gain or lose a couple of pounds at first.  You may feel sick to your stomach (nauseous) and you may throw up (vomit). If the vomiting is uncontrollable, call your health care provider.  You may tire easily.  You may develop headaches that can be relieved by medicines. All medicines should be approved by your health care provider.  You may urinate more often. Painful urination may mean you have a bladder infection.  You may develop heartburn as a result of your pregnancy.  You may develop constipation because certain hormones are causing the muscles that push stool through your intestines to slow down.  You may develop hemorrhoids or swollen veins (varicose veins).  Your breasts may begin to grow larger and become tender. Your nipples may stick out more, and the tissue that surrounds them (areola) may become darker.  Your gums may bleed and may be sensitive to brushing and flossing.  Dark spots or blotches (chloasma, mask of pregnancy) may develop on your face. This will likely fade after the baby is born.  Your menstrual periods will stop.  You may have a loss of appetite.  You may develop cravings for certain kinds of food.  You may have changes in your emotions from day to day, such as being excited to be pregnant or being concerned that something may go wrong with the pregnancy and baby.  You may have more vivid and strange dreams.  You may have changes in your hair. These can include thickening of your hair, rapid growth, and changes in texture. Some women also have hair loss during or after pregnancy, or hair that feels dry or thin. Your hair will most likely return to normal after your baby is born.  What to expect at prenatal visits During a routine prenatal visit:  You will be weighed to make sure you and the baby are growing normally.  Your blood pressure will be taken.  Your abdomen will be  measured to track your baby's growth.  The fetal heartbeat will be listened to between weeks 10 and 14 of your pregnancy.  Test results from any previous visits will be discussed.  Your health care provider may ask you:  How you are feeling.  If you are feeling the baby move.  If you have had any abnormal symptoms, such as leaking fluid, bleeding, severe headaches, or abdominal cramping.  If you are using any tobacco products, including cigarettes, chewing tobacco, and electronic cigarettes.  If you have any questions.  Other tests that may be performed during your first trimester include:  Blood tests to find your blood type and to check for the presence of any previous infections. The tests will also be used to check for low iron levels (anemia) and protein on red blood cells (Rh antibodies). Depending on your risk factors, or if you previously had diabetes during pregnancy, you may have tests to check for high blood sugar that affects pregnant women (gestational diabetes).  Urine tests to check for infections, diabetes, or protein in the urine.  An ultrasound  to confirm the proper growth and development of the baby.  Fetal screens for spinal cord problems (spina bifida) and Down syndrome.  HIV (human immunodeficiency virus) testing. Routine prenatal testing includes screening for HIV, unless you choose not to have this test.  You may need other tests to make sure you and the baby are doing well.  Follow these instructions at home: Medicines  Follow your health care provider's instructions regarding medicine use. Specific medicines may be either safe or unsafe to take during pregnancy.  Take a prenatal vitamin that contains at least 600 micrograms (mcg) of folic acid.  If you develop constipation, try taking a stool softener if your health care provider approves. Eating and drinking  Eat a balanced diet that includes fresh fruits and vegetables, whole grains, good sources  of protein such as meat, eggs, or tofu, and low-fat dairy. Your health care provider will help you determine the amount of weight gain that is right for you.  Avoid raw meat and uncooked cheese. These carry germs that can cause birth defects in the baby.  Eating four or five small meals rather than three large meals a day may help relieve nausea and vomiting. If you start to feel nauseous, eating a few soda crackers can be helpful. Drinking liquids between meals, instead of during meals, also seems to help ease nausea and vomiting.  Limit foods that are high in fat and processed sugars, such as fried and sweet foods.  To prevent constipation: ? Eat foods that are high in fiber, such as fresh fruits and vegetables, whole grains, and beans. ? Drink enough fluid to keep your urine clear or pale yellow. Activity  Exercise only as directed by your health care provider. Most women can continue their usual exercise routine during pregnancy. Try to exercise for 30 minutes at least 5 days a week. Exercising will help you: ? Control your weight. ? Stay in shape. ? Be prepared for labor and delivery.  Experiencing pain or cramping in the lower abdomen or lower back is a good sign that you should stop exercising. Check with your health care provider before continuing with normal exercises.  Try to avoid standing for long periods of time. Move your legs often if you must stand in one place for a long time.  Avoid heavy lifting.  Wear low-heeled shoes and practice good posture.  You may continue to have sex unless your health care provider tells you not to. Relieving pain and discomfort  Wear a good support bra to relieve breast tenderness.  Take warm sitz baths to soothe any pain or discomfort caused by hemorrhoids. Use hemorrhoid cream if your health care provider approves.  Rest with your legs elevated if you have leg cramps or low back pain.  If you develop varicose veins in your legs, wear  support hose. Elevate your feet for 15 minutes, 3-4 times a day. Limit salt in your diet. Prenatal care  Schedule your prenatal visits by the twelfth week of pregnancy. They are usually scheduled monthly at first, then more often in the last 2 months before delivery.  Write down your questions. Take them to your prenatal visits.  Keep all your prenatal visits as told by your health care provider. This is important. Safety  Wear your seat belt at all times when driving.  Make a list of emergency phone numbers, including numbers for family, friends, the hospital, and police and fire departments. General instructions  Ask your health care provider for  a referral to a local prenatal education class. Begin classes no later than the beginning of month 6 of your pregnancy.  Ask for help if you have counseling or nutritional needs during pregnancy. Your health care provider can offer advice or refer you to specialists for help with various needs.  Do not use hot tubs, steam rooms, or saunas.  Do not douche or use tampons or scented sanitary pads.  Do not cross your legs for long periods of time.  Avoid cat litter boxes and soil used by cats. These carry germs that can cause birth defects in the baby and possibly loss of the fetus by miscarriage or stillbirth.  Avoid all smoking, herbs, alcohol, and medicines not prescribed by your health care provider. Chemicals in these products affect the formation and growth of the baby.  Do not use any products that contain nicotine or tobacco, such as cigarettes and e-cigarettes. If you need help quitting, ask your health care provider. You may receive counseling support and other resources to help you quit.  Schedule a dentist appointment. At home, brush your teeth with a soft toothbrush and be gentle when you floss. Contact a health care provider if:  You have dizziness.  You have mild pelvic cramps, pelvic pressure, or nagging pain in the  abdominal area.  You have persistent nausea, vomiting, or diarrhea.  You have a bad smelling vaginal discharge.  You have pain when you urinate.  You notice increased swelling in your face, hands, legs, or ankles.  You are exposed to fifth disease or chickenpox.  You are exposed to [place] measles (rubella) and have never had it. Get help right away if:  You have a fever.  You are leaking fluid from your vagina.  You have spotting or bleeding from your vagina.  You have severe abdominal cramping or pain.  You have rapid weight gain or loss.  You vomit blood or material that looks like coffee grounds.  You develop a severe headache.  You have shortness of breath.  You have any kind of trauma, such as from a fall or a car accident. Summary  The first trimester of pregnancy is from week 1 until the end of week 13 (months 1 through 3).  Your body goes through many changes during pregnancy. The changes vary from woman to woman.  You will have routine prenatal visits. During those visits, your health care provider will examine you, discuss any test results you may have, and talk with you about how you are feeling. This information is not intended to replace advice given to you by your health care provider. Make sure you discuss any questions you have with your health care provider. Document Released: 09/01/2001 Document Revised: 08/19/2016 Document Reviewed: 08/19/2016 Elsevier Interactive Patient Education  [employer].

## 2017-11-22 NOTE — Progress Notes (Signed)
Subjective:   Rachel Campos is a 22 y.o. G2P1001 at [redacted]w[redacted]d by LMP being seen today for her first obstetrical visit.  Her obstetrical history is significant for obesity. Patient does intend to breast feed. Pregnancy history fully reviewed.  Patient reports nausea, no bleeding, no contractions, no cramping, no leaking and vomiting.  HISTORY: Obstetric History   G2   P1   T1   P0   A0   L1    SAB0   TAB0   Ectopic0   Multiple0   Live Births1     # Outcome Date GA Lbr Len/2nd Weight Sex Delivery Anes PTL Lv  2 Current           1 Term 05/10/16 [redacted]w[redacted]d 05:58 / 01:11 8 lb 2.5 oz (3.7 kg) M Vag-Spont EPI  LIV     Name: Murata,BOY Tysha     Apgar1:  9                Apgar5: 9      Last pap smear was done none, 1st pap smear today.   Past Medical History:  Diagnosis Date  . Anxiety   . Depression   . Headache    Past Surgical History:  Procedure Laterality Date  . NO PAST SURGERIES     Family History  Adopted: Yes  Family history unknown: Yes   Social History   Tobacco Use  . Smoking status: Former Games developer  . Smokeless tobacco: Never Used  Substance Use Topics  . Alcohol use: Yes  . Drug use: No   No Known Allergies Current Outpatient Medications on File Prior to Visit  Medication Sig Dispense Refill  . ibuprofen (ADVIL,MOTRIN) 600 MG tablet Take 1 tablet (600 mg total) by mouth every 6 (six) hours. 120 tablet 3   No current facility-administered medications on file prior to visit.     Review of Systems Pertinent items noted in HPI and remainder of comprehensive ROS otherwise negative.  Exam   Vitals:   11/22/17 1101  BP: 118/67  Pulse: 79  Temp: 98.5 F (36.9 C)  Weight: 214 lb (97.1 kg)      Uterus:     Pelvic Exam: Perineum: no hemorrhoids, normal perineum   Vulva: normal external genitalia, no lesions   Vagina:  normal mucosa, normal discharge   Cervix: no lesions and normal, pap smear done.    Adnexa: normal adnexa and no mass, fullness, tenderness   Bony Pelvis: average  System: General: well-developed, well-nourished female in no acute distress   Breast:  normal appearance, no masses or tenderness   Skin: normal coloration and turgor, no rashes   Neurologic: oriented, normal, negative, normal mood   Extremities: normal strength, tone, and muscle mass, ROM of all joints is normal   HEENT PERRLA, extraocular movement intact and sclera clear, anicteric   Mouth/Teeth mucous membranes moist, pharynx normal without lesions and dental hygiene good   Neck supple and no masses   Cardiovascular: regular rate and rhythm   Respiratory:  no respiratory distress, normal breath sounds   Abdomen: soft, non-tender; bowel sounds normal; no masses,  no organomegaly     Assessment:   Pregnancy: G2P1001 Patient Active Problem List   Diagnosis Date Noted  . Supervision of normal pregnancy, antepartum 11/22/2017  . Thoracic spine pain 06/26/2016     Plan:  1. Encounter for supervision of normal pregnancy, antepartum, unspecified gravidity    - Enroll Patient in Babyscripts - Babyscripts Schedule  Optimization - Obstetric Panel, Including HIV - Hemoglobinopathy evaluation - Culture, OB Urine - Cervicovaginal ancillary only - Cytology - PAP - Genetic Screening - Inheritest Core(CF97,SMA,FraX) - Vitamin D (25 hydroxy) - Hemoglobin A1c - US MFM OB DETAIL +14 WK; Future - Prenatal-Fe Fum-Methf-FA w/o A (VITAFOL-NANO) 18-0.6-0.4 MG TABS; Take 1 tablet by mouth daily.  Dispense: 30 tablet; Refill: 12  2. Nausea/vomiting in pregnancy    - Doxylamine-Pyridoxine (DICLEGIS) 10-10 MG TBEC; Take 1 tablet with breakfast and lunch.  Take 2 tablets at bedtime.  Dispense: 100 tablet; Refill: 4   Initial labs drawn. Continue prenatal vitamins. Genetic Screening discussed, NIPS: ordered. Ultrasound discussed; fetal anatomic survey: ordered. Problem list reviewed and updated. The [facility] with multiple MDs  and other Advanced Practice Providers was explained to patient; also emphasized that residents, students are part of our team. Routine obstetric precautions reviewed. Return in about 2 weeks (around 12/06/2017) for ROB, babyscripts.     Orvilla Cornwallachelle Sahra Converse, CNM [facility], [facility]

## 2017-11-22 NOTE — Progress Notes (Signed)
Complains of Nausea & Vomiting x45 days

## 2017-11-23 LAB — CERVICOVAGINAL ANCILLARY ONLY
BACTERIAL VAGINITIS: POSITIVE — AB
CANDIDA VAGINITIS: POSITIVE — AB
Chlamydia: NEGATIVE
NEISSERIA GONORRHEA: NEGATIVE
TRICH (WINDOWPATH): NEGATIVE

## 2017-11-24 ENCOUNTER — Other Ambulatory Visit: Payer: Self-pay | Admitting: Certified Nurse Midwife

## 2017-11-24 DIAGNOSIS — Z3480 Encounter for supervision of other normal pregnancy, unspecified trimester: Secondary | ICD-10-CM

## 2017-11-24 DIAGNOSIS — B9689 Other specified bacterial agents as the cause of diseases classified elsewhere: Secondary | ICD-10-CM

## 2017-11-24 DIAGNOSIS — B3731 Acute candidiasis of vulva and vagina: Secondary | ICD-10-CM

## 2017-11-24 DIAGNOSIS — B373 Candidiasis of vulva and vagina: Secondary | ICD-10-CM

## 2017-11-24 DIAGNOSIS — E559 Vitamin D deficiency, unspecified: Secondary | ICD-10-CM | POA: Insufficient documentation

## 2017-11-24 DIAGNOSIS — N760 Acute vaginitis: Secondary | ICD-10-CM

## 2017-11-24 LAB — VITAMIN D 25 HYDROXY (VIT D DEFICIENCY, FRACTURES): VIT D 25 HYDROXY: 15.9 ng/mL — AB (ref 30.0–100.0)

## 2017-11-24 LAB — OBSTETRIC PANEL, INCLUDING HIV
ANTIBODY SCREEN: NEGATIVE
BASOS: 0 %
Basophils Absolute: 0 10*3/uL (ref 0.0–0.2)
EOS (ABSOLUTE): 0.1 10*3/uL (ref 0.0–0.4)
EOS: 1 %
HEMATOCRIT: 39.5 % (ref 34.0–46.6)
HEMOGLOBIN: 13.6 g/dL (ref 11.1–15.9)
HEP B S AG: NEGATIVE
HIV SCREEN 4TH GENERATION: NONREACTIVE
Immature Grans (Abs): 0 10*3/uL (ref 0.0–0.1)
Immature Granulocytes: 0 %
LYMPHS: 17 %
Lymphocytes Absolute: 1.5 10*3/uL (ref 0.7–3.1)
MCH: 30.6 pg (ref 26.6–33.0)
MCHC: 34.4 g/dL (ref 31.5–35.7)
MCV: 89 fL (ref 79–97)
MONOCYTES: 6 %
Monocytes Absolute: 0.5 10*3/uL (ref 0.1–0.9)
NEUTROS ABS: 6.7 10*3/uL (ref 1.4–7.0)
Neutrophils: 76 %
Platelets: 295 10*3/uL (ref 150–379)
RBC: 4.44 x10E6/uL (ref 3.77–5.28)
RDW: 13.5 % (ref 12.3–15.4)
RPR: NONREACTIVE
RUBELLA: 13 {index} (ref >0.99)
Rh Factor: NEGATIVE
WBC: 8.8 10*3/uL (ref 3.4–10.8)

## 2017-11-24 LAB — HEMOGLOBIN A1C
Est. average glucose Bld gHb Est-mCnc: 105 mg/dL
HEMOGLOBIN A1C: 5.3 % (ref 4.8–5.6)

## 2017-11-24 LAB — HEMOGLOBINOPATHY EVALUATION
HEMOGLOBIN A2 QUANTITATION: 2.5 % (ref 1.8–3.2)
HEMOGLOBIN F QUANTITATION: 0 % (ref 0.0–2.0)
HGB C: 0 %
HGB S: 0 %
HGB VARIANT: 0 %
Hgb A: 97.5 % (ref 96.4–98.8)

## 2017-11-24 LAB — URINE CULTURE, OB REFLEX

## 2017-11-24 LAB — CYTOLOGY - PAP: Diagnosis: NEGATIVE

## 2017-11-24 LAB — CULTURE, OB URINE

## 2017-11-24 MED ORDER — VITAMIN D (ERGOCALCIFEROL) 1.25 MG (50000 UNIT) PO CAPS: 50000.0000 [IU] | ORAL_CAPSULE | ORAL | 2 refills | Status: DC

## 2017-11-24 MED ORDER — METRONIDAZOLE 0.75 % VA GEL: 1.0000 | Freq: Two times a day (BID) | VAGINAL | 0 refills | Status: DC

## 2017-11-24 MED ORDER — TERCONAZOLE 0.8 % VA CREA: 1.0000 | TOPICAL_CREAM | Freq: Every day | VAGINAL | 0 refills | Status: DC

## 2017-11-29 ENCOUNTER — Encounter: Payer: Self-pay | Admitting: *Deleted

## 2017-11-29 ENCOUNTER — Encounter: Payer: Self-pay | Admitting: Obstetrics & Gynecology

## 2017-11-30 ENCOUNTER — Other Ambulatory Visit: Payer: Self-pay | Admitting: Certified Nurse Midwife

## 2017-11-30 DIAGNOSIS — Z3480 Encounter for supervision of other normal pregnancy, unspecified trimester: Secondary | ICD-10-CM

## 2017-12-02 LAB — INHERITEST CORE(CF97,SMA,FRAX)

## 2017-12-03 ENCOUNTER — Other Ambulatory Visit: Payer: Self-pay | Admitting: Certified Nurse Midwife

## 2017-12-03 DIAGNOSIS — Z3480 Encounter for supervision of other normal pregnancy, unspecified trimester: Secondary | ICD-10-CM

## 2017-12-06 ENCOUNTER — Ambulatory Visit (INDEPENDENT_AMBULATORY_CARE_PROVIDER_SITE_OTHER): Payer: BLUE CROSS/BLUE SHIELD | Admitting: Certified Nurse Midwife

## 2017-12-06 ENCOUNTER — Encounter: Payer: Self-pay | Admitting: Certified Nurse Midwife

## 2017-12-06 VITALS — BP 121/79 | HR 77 | Wt 213.8 lb

## 2017-12-06 DIAGNOSIS — Z3480 Encounter for supervision of other normal pregnancy, unspecified trimester: Secondary | ICD-10-CM

## 2017-12-06 DIAGNOSIS — Z6791 Unspecified blood type, Rh negative: Secondary | ICD-10-CM

## 2017-12-06 DIAGNOSIS — O26899 Other specified pregnancy related conditions, unspecified trimester: Secondary | ICD-10-CM

## 2017-12-06 DIAGNOSIS — O09899 Supervision of other high risk pregnancies, unspecified trimester: Secondary | ICD-10-CM

## 2017-12-06 DIAGNOSIS — E559 Vitamin D deficiency, unspecified: Secondary | ICD-10-CM

## 2017-12-06 NOTE — Progress Notes (Signed)
   PRENATAL VISIT NOTE  Subjective:  Rachel Campos is a 22 y.o. G2P1001 at 861w0d being seen today for ongoing prenatal care.  She is currently monitored for the following issues for this low-risk pregnancy and has Supervision of normal pregnancy, antepartum; Rh negative state in antepartum period; Obesity in pregnancy; and Vitamin D deficiency on their problem list.  Patient reports no complaints.  Contractions: Irritability. Vag. Bleeding: None.   . Denies leaking of fluid.   The following portions of the patient's history were reviewed and updated as appropriate: allergies, current medications, past family history, past medical history, past social history, past surgical history and problem list. Problem list updated.  Objective:   Vitals:   12/06/17 0939  BP: 121/79  Pulse: 77  Weight: 213 lb 12.8 oz (97 kg)    Fetal Status: Fetal Heart Rate (bpm): 155; doppler         General:  Alert, oriented and cooperative. Patient is in no acute distress.  Skin: Skin is warm and dry. No rash noted.   Cardiovascular: Normal heart rate noted  Respiratory: Normal respiratory effort, no problems with respiration noted  Abdomen: Soft, gravid, appropriate for gestational age.  Pain/Pressure: Absent     Pelvic: Cervical exam deferred        Extremities: Normal range of motion.  Edema: Trace  Mental Status:  Normal mood and affect. Normal behavior. Normal judgment and thought content.   Assessment and Plan:  Pregnancy: G2P1001 at 562w0d  1. Supervision of other normal pregnancy, antepartum     Doing well.  No complaints.  Has blood pressure cuff, encouraged to activate.   2. Vitamin D deficiency     Taking weekly vitamin D  3. Rh negative state in antepartum period     Rhogam planned for 28 weeks.  Preterm labor symptoms and general obstetric precautions including but not limited to vaginal bleeding, contractions, leaking of fluid and fetal movement were reviewed in detail with the  patient. Please refer to After Visit Summary for other counseling recommendations.  Return in about 8 weeks (around 01/31/2018) for ROB, babyscripts.   Roe Coombsachelle A Lavontay Kirk, CNM

## 2018-01-18 ENCOUNTER — Telehealth: Payer: Self-pay | Admitting: Certified Nurse Midwife

## 2018-01-18 ENCOUNTER — Encounter (INDEPENDENT_AMBULATORY_CARE_PROVIDER_SITE_OTHER): Payer: Self-pay

## 2018-01-18 NOTE — Telephone Encounter (Signed)
Voicemail and My Chart message sent to patient to remind her to upload a BP and weight into her Babyscripts APP.

## 2018-01-20 ENCOUNTER — Encounter: Payer: BLUE CROSS/BLUE SHIELD | Admitting: Certified Nurse Midwife

## 2018-01-24 ENCOUNTER — Ambulatory Visit (HOSPITAL_COMMUNITY)
Admission: RE | Admit: 2018-01-24 | Discharge: 2018-01-24 | Disposition: A | Discharge status: Home / Self Care | Payer: BLUE CROSS/BLUE SHIELD | Source: Ambulatory Visit | Attending: Certified Nurse Midwife | Admitting: Certified Nurse Midwife

## 2018-01-24 ENCOUNTER — Other Ambulatory Visit: Payer: Self-pay | Admitting: Certified Nurse Midwife

## 2018-01-24 DIAGNOSIS — Z3A19 19 weeks gestation of pregnancy: Secondary | ICD-10-CM

## 2018-01-24 DIAGNOSIS — O99212 Obesity complicating pregnancy, second trimester: Secondary | ICD-10-CM | POA: Diagnosis present

## 2018-01-24 DIAGNOSIS — Z363 Encounter for antenatal screening for malformations: Secondary | ICD-10-CM | POA: Insufficient documentation

## 2018-01-24 DIAGNOSIS — Z3490 Encounter for supervision of normal pregnancy, unspecified, unspecified trimester: Secondary | ICD-10-CM

## 2018-01-24 DIAGNOSIS — Z3689 Encounter for other specified antenatal screening: Secondary | ICD-10-CM

## 2018-01-24 DIAGNOSIS — Z3480 Encounter for supervision of other normal pregnancy, unspecified trimester: Secondary | ICD-10-CM

## 2018-01-25 ENCOUNTER — Telehealth: Payer: Self-pay | Admitting: *Deleted

## 2018-01-25 ENCOUNTER — Other Ambulatory Visit: Payer: Self-pay | Admitting: Certified Nurse Midwife

## 2018-01-25 DIAGNOSIS — Z3480 Encounter for supervision of other normal pregnancy, unspecified trimester: Secondary | ICD-10-CM

## 2018-01-25 NOTE — Telephone Encounter (Signed)
Left vmail for patient to callback and reschedule missed OB appts.

## 2018-01-31 ENCOUNTER — Ambulatory Visit (INDEPENDENT_AMBULATORY_CARE_PROVIDER_SITE_OTHER): Payer: BLUE CROSS/BLUE SHIELD | Admitting: Certified Nurse Midwife

## 2018-01-31 ENCOUNTER — Encounter: Payer: Self-pay | Admitting: Certified Nurse Midwife

## 2018-01-31 ENCOUNTER — Encounter: Payer: BLUE CROSS/BLUE SHIELD | Admitting: Certified Nurse Midwife

## 2018-01-31 VITALS — BP 118/72 | HR 79 | Wt 212.0 lb

## 2018-01-31 DIAGNOSIS — L282 Other prurigo: Secondary | ICD-10-CM

## 2018-01-31 DIAGNOSIS — B373 Candidiasis of vulva and vagina: Secondary | ICD-10-CM

## 2018-01-31 DIAGNOSIS — Z3480 Encounter for supervision of other normal pregnancy, unspecified trimester: Secondary | ICD-10-CM

## 2018-01-31 DIAGNOSIS — O26899 Other specified pregnancy related conditions, unspecified trimester: Secondary | ICD-10-CM

## 2018-01-31 DIAGNOSIS — O99712 Diseases of the skin and subcutaneous tissue complicating pregnancy, second trimester: Secondary | ICD-10-CM

## 2018-01-31 DIAGNOSIS — Z6791 Unspecified blood type, Rh negative: Secondary | ICD-10-CM

## 2018-01-31 DIAGNOSIS — E559 Vitamin D deficiency, unspecified: Secondary | ICD-10-CM

## 2018-01-31 DIAGNOSIS — B3731 Acute candidiasis of vulva and vagina: Secondary | ICD-10-CM

## 2018-01-31 MED ORDER — FLUCONAZOLE 150 MG PO TABS: 150.0000 mg | ORAL_TABLET | Freq: Once | ORAL | 0 refills | Status: AC

## 2018-01-31 MED ORDER — HYDROXYZINE PAMOATE 50 MG PO CAPS: 50.0000 mg | ORAL_CAPSULE | Freq: Three times a day (TID) | ORAL | 0 refills | Status: DC | PRN

## 2018-01-31 MED ORDER — TRIAMCINOLONE ACETONIDE 0.5 % EX OINT: 1.0000 "application " | TOPICAL_OINTMENT | Freq: Two times a day (BID) | CUTANEOUS | 2 refills | Status: DC

## 2018-01-31 MED ORDER — TERCONAZOLE 0.8 % VA CREA: 1.0000 | TOPICAL_CREAM | Freq: Every day | VAGINAL | 0 refills | Status: DC

## 2018-01-31 NOTE — Progress Notes (Signed)
   PRENATAL VISIT NOTE  Subjective:  Rachel Campos is a 22 y.o. G2P1001 at 27w0dbeing seen today for ongoing prenatal care.  She is currently monitored for the following issues for this low-risk pregnancy and has Supervision of normal pregnancy, antepartum; Rh negative state in antepartum period; Obesity in pregnancy; and Vitamin D deficiency on their problem list.  Patient reports no bleeding, no contractions, no cramping, no leaking and generalized severe itching all over her body, waking her up at night.  Contractions: Irritability. Vag. Bleeding: None.   . Denies leaking of fluid.   The following portions of the patient's history were reviewed and updated as appropriate: allergies, current medications, past family history, past medical history, past social history, past surgical history and problem list. Problem list updated.  Objective:   Vitals:   01/31/18 1107  BP: 118/72  Pulse: 79  Weight: 212 lb (96.2 kg)    Fetal Status:           General:  Alert, oriented and cooperative. Patient is in no acute distress.  Skin: Skin is warm and dry. No rash noted.   Cardiovascular: Normal heart rate noted  Respiratory: Normal respiratory effort, no problems with respiration noted  Abdomen: Soft, gravid, appropriate for gestational age.  Pain/Pressure: Absent     Pelvic: Cervical exam deferred        Extremities: Normal range of motion.     Mental Status: Normal mood and affect. Normal behavior. Normal judgment and thought content.   Assessment and Plan:  Pregnancy: G2P1001 at 211w0d1. Supervision of other normal pregnancy, antepartum     - AFP, Serum, Open Spina Bifida  2. Vitamin D deficiency     Taking weekly vitamin D  3. Rh negative state in antepartum period     Rhogam at 28 weeks  4. Yeast vaginitis      - fluconazole (DIFLUCAN) 150 MG tablet; Take 1 tablet (150 mg total) by mouth once for 1 dose.  Dispense: 1 tablet; Refill: 0 - terconazole (TERAZOL 3) 0.8 % vaginal  cream; Place 1 applicator vaginally at bedtime.  Dispense: 20 g; Refill: 0  5. Prurigo of pregnancy, antepartum, second trimester     Vs. Cholestasis - Bile acids, total - Comp Met (CMET) - hydrOXYzine (VISTARIL) 50 MG capsule; Take 1 capsule (50 mg total) by mouth 3 (three) times daily as needed.  Dispense: 30 capsule; Refill: 0 - triamcinolone ointment (KENALOG) 0.5 %; Apply 1 application topically 2 (two) times daily.  Dispense: 90 g; Refill: 2  Preterm labor symptoms and general obstetric precautions including but not limited to vaginal bleeding, contractions, leaking of fluid and fetal movement were reviewed in detail with the patient. Please refer to After Visit Summary for other counseling recommendations.  Return in about 1 month (around 02/28/2018) for [facility] Future Appointments  Date Time Provider DeDenali6/02/2018 10:30 AM [facility] [place] [facility]    RaMorene CrockerCNM

## 2018-02-02 ENCOUNTER — Telehealth: Payer: Self-pay

## 2018-02-02 ENCOUNTER — Other Ambulatory Visit: Payer: Self-pay | Admitting: Certified Nurse Midwife

## 2018-02-02 DIAGNOSIS — O26612 Liver and biliary tract disorders in pregnancy, second trimester: Principal | ICD-10-CM

## 2018-02-02 DIAGNOSIS — K831 Obstruction of bile duct: Secondary | ICD-10-CM

## 2018-02-02 LAB — AFP, SERUM, OPEN SPINA BIFIDA
AFP MOM: 0.74
AFP Value: 33.2 ng/mL
GEST. AGE ON COLLECTION DATE: 20 wk
Maternal Age At EDD: 23 yr
OSBR RISK 1 IN: 10000
Test Results:: NEGATIVE
WEIGHT: 212 [lb_av]

## 2018-02-02 LAB — COMPREHENSIVE METABOLIC PANEL
ALK PHOS: 57 IU/L (ref 39–117)
ALT: 12 IU/L (ref 0–32)
AST: 12 IU/L (ref 0–40)
Albumin/Globulin Ratio: 1.4 (ref 1.2–2.2)
Albumin: 3.8 g/dL (ref 3.5–5.5)
BUN/Creatinine Ratio: 10 (ref 9–23)
BUN: 5 mg/dL — ABNORMAL LOW (ref 6–20)
Bilirubin Total: <0.2 mg/dL (ref 0.0–1.2)
CALCIUM: 9.1 mg/dL (ref 8.7–10.2)
CO2: 17 mmol/L — ABNORMAL LOW (ref 20–29)
Chloride: 106 mmol/L (ref 96–106)
Creatinine, Ser: 0.51 mg/dL — ABNORMAL LOW (ref 0.57–1.00)
GFR calc Af Amer: 158 mL/min/{1.73_m2} (ref >59)
GFR, EST NON AFRICAN AMERICAN: 137 mL/min/{1.73_m2} (ref >59)
GLOBULIN, TOTAL: 2.7 g/dL (ref 1.5–4.5)
Glucose: 86 mg/dL (ref 65–99)
POTASSIUM: 4 mmol/L (ref 3.5–5.2)
SODIUM: 137 mmol/L (ref 134–144)
Total Protein: 6.5 g/dL (ref 6.0–8.5)

## 2018-02-02 LAB — BILE ACIDS, TOTAL: BILE ACIDS TOTAL: 10.8 umol/L — AB (ref 4.7–24.5)

## 2018-02-02 MED ORDER — URSODIOL 250 MG PO TABS: 250.0000 mg | ORAL_TABLET | Freq: Three times a day (TID) | ORAL | 5 refills | Status: DC

## 2018-02-02 NOTE — Progress Notes (Signed)
Cholestasis on lab work

## 2018-02-02 NOTE — Telephone Encounter (Signed)
Please notify her that her lab work was abnormal and she now has Cholestasis of pregnancy.  A message was sent to the front office to schedule her a f/u appointment with Faculty practice.  Actigall was sent to the pharmacy for her to start taking to reduce her bile acids.  Thank you Boykin Reaper

## 2018-02-02 NOTE — Telephone Encounter (Signed)
Rachel Campos form [facility] called to provided elevated bile acid reading of 10.8

## 2018-02-02 NOTE — Telephone Encounter (Signed)
Pt informed

## 2018-02-04 ENCOUNTER — Other Ambulatory Visit: Payer: Self-pay | Admitting: Certified Nurse Midwife

## 2018-02-04 DIAGNOSIS — O0990 Supervision of high risk pregnancy, unspecified, unspecified trimester: Secondary | ICD-10-CM

## 2018-02-24 ENCOUNTER — Ambulatory Visit (HOSPITAL_COMMUNITY)
Admission: RE | Admit: 2018-02-24 | Discharge: 2018-02-24 | Disposition: A | Discharge status: Home / Self Care | Payer: BLUE CROSS/BLUE SHIELD | Source: Ambulatory Visit | Attending: Certified Nurse Midwife | Admitting: Certified Nurse Midwife

## 2018-02-24 ENCOUNTER — Other Ambulatory Visit: Payer: Self-pay | Admitting: Certified Nurse Midwife

## 2018-02-24 DIAGNOSIS — Z3480 Encounter for supervision of other normal pregnancy, unspecified trimester: Secondary | ICD-10-CM

## 2018-02-24 DIAGNOSIS — Z362 Encounter for other antenatal screening follow-up: Secondary | ICD-10-CM | POA: Diagnosis present

## 2018-02-24 DIAGNOSIS — Z3A23 23 weeks gestation of pregnancy: Secondary | ICD-10-CM

## 2018-02-24 DIAGNOSIS — O99212 Obesity complicating pregnancy, second trimester: Secondary | ICD-10-CM

## 2018-02-25 ENCOUNTER — Other Ambulatory Visit: Payer: Self-pay | Admitting: Certified Nurse Midwife

## 2018-02-25 DIAGNOSIS — O0990 Supervision of high risk pregnancy, unspecified, unspecified trimester: Secondary | ICD-10-CM

## 2018-02-28 ENCOUNTER — Ambulatory Visit (INDEPENDENT_AMBULATORY_CARE_PROVIDER_SITE_OTHER): Payer: BLUE CROSS/BLUE SHIELD | Admitting: Obstetrics & Gynecology

## 2018-02-28 VITALS — BP 106/65 | HR 85 | Wt 215.5 lb

## 2018-02-28 DIAGNOSIS — O26612 Liver and biliary tract disorders in pregnancy, second trimester: Secondary | ICD-10-CM

## 2018-02-28 DIAGNOSIS — O0990 Supervision of high risk pregnancy, unspecified, unspecified trimester: Secondary | ICD-10-CM

## 2018-02-28 DIAGNOSIS — K831 Obstruction of bile duct: Secondary | ICD-10-CM

## 2018-02-28 DIAGNOSIS — O0992 Supervision of high risk pregnancy, unspecified, second trimester: Secondary | ICD-10-CM

## 2018-02-28 NOTE — Patient Instructions (Signed)
Cholestasis of Pregnancy Cholestasis refers to any condition that causes the flow of digestive fluid (bile) produced by the liver to slow down or stop. Cholestasis of pregnancy is most common toward the end of pregnancy (thirdtrimester), but it can occur any time during pregnancy. The condition often goes away soon after giving birth. Cholestasis may be uncomfortable, but it is usually harmless to you. However, it can be harmful to your baby. Cholestasis may increase the risk of:  Your baby being born too early (preterm delivery).  Your baby having a slow heart rate and lack of oxygen during delivery (fetal distress).  Losing your baby before delivery (stillbirth).  What are the causes? The exact cause of this condition is not known, but it may be related to:  Pregnancy hormones. The gallbladder normally holds bile until you need it to help digest fat in your diet. Pregnancy hormones may cause the flow of bile to slow down and back up into your liver. Bile may then get into your bloodstream and cause cholestasis symptoms.  Changes in your genes (genetic mutations). Specifically, genes that affect how the liver releases bile.  What increases the risk? You are more likely to develop this condition if:  You had cholestasis during a previous pregnancy.  You have a family history of cholestasis.  You have liver problems.  You are having multiple babies, such as twins or triplets.  What are the signs or symptoms? The most common symptom of this condition is intense itching (pruritus), especially on the palms of your hands and soles of your feet. The itching can spread to the rest of your body and is often worse at night. You will not usually have a rash. Other symptoms may include:  Feeling tired.  Pain in your upper right abdomen.  Dark-colored urine.  Light-colored stools.  Poor appetite.  Yellowish discoloration of your skin and the whites of your eyes (jaundice).  How is this  diagnosed? This condition is diagnosed based on:  Your medical history.  A physical exam.  Blood tests.  If you have an inherited risk for developing this condition, you may also have genetic testing. How is this treated? The goal of treatment is to make you comfortable and keep your baby safe. Your health care provider may:  Prescribe medicine to reduce bile acid in your bloodstream, relieve symptoms, and help keep your baby safe.  Give you vitamin K before delivery to prevent excessive bleeding.  Check your baby frequently (fetal monitoring).  Perform regular blood tests to check your bile levels and liver function until your baby is delivered.  Recommend starting (inducing) your labor and delivery by week 36 or 37 of pregnancy, or as soon as your baby's lungs have developed enough.  Follow these instructions at home:  Take over-the-counter and prescription medicines only as told by your health care provider.  Take cool baths to soothe itchy skin.  Wear comfortable, loose-fitting, cotton clothing to reduce itching.  Keep your fingernails short to prevent skin irritation from scratching.  Keep all follow-up visits and prenatal visits as told by your health care provider. This is important. Contact a health care provider if:  Your symptoms get worse, even with treatment.  You develop pain in your right side.  You have unusual swelling in your abdomen, feet, ankles, or legs.  You have a fever.  You are more thirsty than usual. Get help right away if:  You go into early labor.  You have a headache that   does not go away or causes changes in vision.  You have nausea or you vomit.  You have severe pain in your abdomen or shoulders.  You have shortness of breath. Summary  Cholestasis of pregnancy is most common toward the end of pregnancy (thirdtrimester), but it can occur any time during your pregnancy.  The condition often goes away soon after your baby is  born.  The most common symptom of cholestasis of pregnancy is intense itching (pruritus), especially on the palms of your hands and soles of your feet.  This condition may be treated with medicine, frequent monitoring, or starting (inducing) labor and delivery by week 36 or 37 of pregnancy. This information is not intended to replace advice given to you by your health care provider. Make sure you discuss any questions you have with your health care provider. Document Released: 09/04/2000 Document Revised: 08/22/2016 Document Reviewed: 08/22/2016 Elsevier Interactive Patient Education  2017 [employer]  

## 2018-02-28 NOTE — Progress Notes (Signed)
   PRENATAL VISIT NOTE  Subjective:  Rachel Campos is a 22 y.o. G2P1001 at 1148w0d being seen today for ongoing prenatal care.  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  Patient reports occasional itching of extremities with no rash.  Contractions: Irritability. Vag. Bleeding: None.  Movement: Present. Denies leaking of fluid.   The following portions of the patient's history were reviewed and updated as appropriate: allergies, current medications, past family history, past medical history, past social history, past surgical history and problem list. Problem list updated.  Objective:   Vitals:   02/28/18 1049  BP: 106/65  Pulse: 85  Weight: 215 lb 8 oz (97.8 kg)    Fetal Status:     Movement: Present     General:  Alert, oriented and cooperative. Patient is in no acute distress.  Skin: Skin is warm and dry. No rash noted.   Cardiovascular: Normal heart rate noted  Respiratory: Normal respiratory effort, no problems with respiration noted  Abdomen: Soft, gravid, appropriate for gestational age.  Pain/Pressure: Absent     Pelvic: Cervical exam deferred        Extremities: Normal range of motion.  Edema: Trace  Mental Status: Normal mood and affect. Normal behavior. Normal judgment and thought content.   Assessment and Plan:  Pregnancy: G2P1001 at 1348w0d  1. 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 Supervision of high risk pregnancy, antepartum Cholestasis mild lab elevation  Preterm labor symptoms and general obstetric precautions including but not limited to vaginal bleeding, contractions, leaking of fluid and fetal movement were reviewed in detail with the  patient. Please refer to After Visit Summary for other counseling recommendations.  Return in about 2 weeks (around 03/14/2018) for 2 hr GTT.  No future appointments.  Scheryl DarterJames Arnold, MD

## 2018-03-02 LAB — BILE ACIDS, TOTAL: BILE ACIDS TOTAL: 9.7 umol/L (ref 4.7–24.5)

## 2018-03-14 ENCOUNTER — Other Ambulatory Visit: Payer: BLUE CROSS/BLUE SHIELD

## 2018-03-14 ENCOUNTER — Encounter: Payer: BLUE CROSS/BLUE SHIELD | Admitting: Certified Nurse Midwife

## 2018-03-14 ENCOUNTER — Ambulatory Visit (INDEPENDENT_AMBULATORY_CARE_PROVIDER_SITE_OTHER): Payer: BLUE CROSS/BLUE SHIELD | Admitting: Obstetrics and Gynecology

## 2018-03-14 ENCOUNTER — Other Ambulatory Visit (HOSPITAL_COMMUNITY)
Admission: RE | Admit: 2018-03-14 | Discharge: 2018-03-14 | Disposition: A | Discharge status: Home / Self Care | Payer: BLUE CROSS/BLUE SHIELD | Source: Ambulatory Visit | Attending: Obstetrics and Gynecology | Admitting: Obstetrics and Gynecology

## 2018-03-14 ENCOUNTER — Encounter: Payer: Self-pay | Admitting: Obstetrics and Gynecology

## 2018-03-14 VITALS — BP 118/77 | HR 86 | Wt 218.0 lb

## 2018-03-14 DIAGNOSIS — N898 Other specified noninflammatory disorders of vagina: Secondary | ICD-10-CM

## 2018-03-14 DIAGNOSIS — B373 Candidiasis of vulva and vagina: Secondary | ICD-10-CM | POA: Diagnosis not present

## 2018-03-14 DIAGNOSIS — K831 Obstruction of bile duct: Secondary | ICD-10-CM

## 2018-03-14 DIAGNOSIS — O26892 Other specified pregnancy related conditions, second trimester: Secondary | ICD-10-CM

## 2018-03-14 DIAGNOSIS — Z6791 Unspecified blood type, Rh negative: Secondary | ICD-10-CM

## 2018-03-14 DIAGNOSIS — R200 Anesthesia of skin: Secondary | ICD-10-CM

## 2018-03-14 DIAGNOSIS — O0992 Supervision of high risk pregnancy, unspecified, second trimester: Secondary | ICD-10-CM

## 2018-03-14 DIAGNOSIS — O26612 Liver and biliary tract disorders in pregnancy, second trimester: Secondary | ICD-10-CM

## 2018-03-14 DIAGNOSIS — O26899 Other specified pregnancy related conditions, unspecified trimester: Principal | ICD-10-CM

## 2018-03-14 DIAGNOSIS — O0990 Supervision of high risk pregnancy, unspecified, unspecified trimester: Secondary | ICD-10-CM

## 2018-03-14 MED ORDER — CLOTRIMAZOLE 1 % VA CREA: 1.0000 | TOPICAL_CREAM | Freq: Every day | VAGINAL | 0 refills | Status: DC

## 2018-03-14 NOTE — Progress Notes (Signed)
Pt states her right leg always goes numb throughout the day. Esp while working.   Tdap: Declined.   Rhogam at 28 weeks per notes.

## 2018-03-14 NOTE — Progress Notes (Signed)
   PRENATAL VISIT NOTE  Subjective:  Rachel BurowOlga Campos is a 22 y.o. G2P1001 at 562w0d being seen today for ongoing prenatal care.  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  Patient reports her right leg is occasionally going numb at work from hip to knee, goes away on its own.  Contractions: Not present. Vag. Bleeding: None.  Movement: Present. Denies leaking of fluid. Reports constant green discharge that is getting worse, has been happening since early pregnancy and is constant.  The following portions of the patient's history were reviewed and updated as appropriate: allergies, current medications, past family history, past medical history, past social history, past surgical history and problem list. Problem list updated.  Objective:   Vitals:   03/14/18 0924  BP: 118/77  Pulse: 86  Weight: 218 lb (98.9 kg)    Fetal Status: Fetal Heart Rate (bpm): 142   Movement: Present     General:  Alert, oriented and cooperative. Patient is in no acute distress.  Skin: Skin is warm and dry. No rash noted.   Cardiovascular: Normal heart rate noted  Respiratory: Normal respiratory effort, no problems with respiration noted  Abdomen: Soft, gravid, appropriate for gestational age.  Pain/Pressure: Present     Pelvic: Cervical exam deferred        Extremities: Normal range of motion.  Edema: Mild pitting, slight indentation  Mental Status: Normal mood and affect. Normal behavior. Normal judgment and thought content.   Assessment and Plan:  Pregnancy: G2P1001 at 5462w0d  1. Supervision of high risk pregnancy, antepartum  2. Rh negative state in antepartum period Needs Rho gam 28 weeks  3. 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 Vaginal discharge Appearance of yeast Clotrimazole sent to pharmacy - Cervicovaginal ancillary only   Preterm labor symptoms and general obstetric precautions including but not limited to vaginal bleeding, contractions, leaking of fluid and fetal movement were reviewed in detail with the patient. Please refer to After Visit Summary for other counseling recommendations.  Return in about 2 weeks (around 03/28/2018).  Future Appointments  Date Time Provider Department Center  03/28/2018 11:15 AM Constant, Gigi GinPeggy, MD [facility] None  03/28/2018  1:15 PM [facility] US 4 [facility]    Conan BowensKelly M Callie Facey, MD

## 2018-03-15 LAB — CBC
Hematocrit: 35.5 % (ref 34.0–46.6)
Hemoglobin: 11.9 g/dL (ref 11.1–15.9)
MCH: 30.4 pg (ref 26.6–33.0)
MCHC: 33.5 g/dL (ref 31.5–35.7)
MCV: 91 fL (ref 79–97)
PLATELETS: 215 10*3/uL (ref 150–450)
RBC: 3.91 x10E6/uL (ref 3.77–5.28)
RDW: 13.6 % (ref 12.3–15.4)
WBC: 10.8 10*3/uL (ref 3.4–10.8)

## 2018-03-15 LAB — HIV ANTIBODY (ROUTINE TESTING W REFLEX): HIV SCREEN 4TH GENERATION: NONREACTIVE

## 2018-03-15 LAB — CERVICOVAGINAL ANCILLARY ONLY
BACTERIAL VAGINITIS: NEGATIVE
CANDIDA VAGINITIS: POSITIVE — AB
Chlamydia: NEGATIVE
Neisseria Gonorrhea: NEGATIVE
Trichomonas: NEGATIVE

## 2018-03-15 LAB — GLUCOSE TOLERANCE, 2 HOURS W/ 1HR
GLUCOSE, FASTING: 80 mg/dL (ref 65–91)
Glucose, 1 hour: 126 mg/dL (ref 65–179)
Glucose, 2 hour: 95 mg/dL (ref 65–152)

## 2018-03-15 LAB — RPR: RPR: NONREACTIVE

## 2018-03-28 ENCOUNTER — Ambulatory Visit (INDEPENDENT_AMBULATORY_CARE_PROVIDER_SITE_OTHER): Payer: BLUE CROSS/BLUE SHIELD | Admitting: Obstetrics and Gynecology

## 2018-03-28 ENCOUNTER — Ambulatory Visit (HOSPITAL_COMMUNITY)
Admission: RE | Admit: 2018-03-28 | Discharge: 2018-03-28 | Disposition: A | Discharge status: Home / Self Care | Payer: BLUE CROSS/BLUE SHIELD | Source: Ambulatory Visit | Attending: Obstetrics and Gynecology | Admitting: Obstetrics and Gynecology

## 2018-03-28 ENCOUNTER — Encounter: Payer: Self-pay | Admitting: Obstetrics and Gynecology

## 2018-03-28 VITALS — BP 120/74 | HR 81 | Wt 219.7 lb

## 2018-03-28 DIAGNOSIS — O26612 Liver and biliary tract disorders in pregnancy, second trimester: Secondary | ICD-10-CM

## 2018-03-28 DIAGNOSIS — K831 Obstruction of bile duct: Secondary | ICD-10-CM | POA: Diagnosis present

## 2018-03-28 DIAGNOSIS — O360930 Maternal care for other rhesus isoimmunization, third trimester, not applicable or unspecified: Secondary | ICD-10-CM | POA: Diagnosis not present

## 2018-03-28 DIAGNOSIS — O26892 Other specified pregnancy related conditions, second trimester: Secondary | ICD-10-CM

## 2018-03-28 DIAGNOSIS — Z6791 Unspecified blood type, Rh negative: Secondary | ICD-10-CM

## 2018-03-28 DIAGNOSIS — O99213 Obesity complicating pregnancy, third trimester: Secondary | ICD-10-CM | POA: Diagnosis not present

## 2018-03-28 DIAGNOSIS — O0990 Supervision of high risk pregnancy, unspecified, unspecified trimester: Secondary | ICD-10-CM

## 2018-03-28 DIAGNOSIS — O26899 Other specified pregnancy related conditions, unspecified trimester: Secondary | ICD-10-CM

## 2018-03-28 DIAGNOSIS — O0992 Supervision of high risk pregnancy, unspecified, second trimester: Secondary | ICD-10-CM

## 2018-03-28 DIAGNOSIS — O26613 Liver and biliary tract disorders in pregnancy, third trimester: Secondary | ICD-10-CM | POA: Insufficient documentation

## 2018-03-28 DIAGNOSIS — E669 Obesity, unspecified: Secondary | ICD-10-CM | POA: Insufficient documentation

## 2018-03-28 DIAGNOSIS — Z3A28 28 weeks gestation of pregnancy: Secondary | ICD-10-CM | POA: Diagnosis not present

## 2018-03-28 DIAGNOSIS — O99210 Obesity complicating pregnancy, unspecified trimester: Secondary | ICD-10-CM

## 2018-03-28 MED ORDER — RHO D IMMUNE GLOBULIN 1500 UNIT/2ML IJ SOSY
300.0000 ug | PREFILLED_SYRINGE | Freq: Once | INTRAMUSCULAR | Status: AC
  Administered 2018-03-28: 300 ug via INTRAMUSCULAR

## 2018-03-28 NOTE — Progress Notes (Addendum)
   PRENATAL VISIT NOTE  Subjective:  Rachel BurowOlga Campos is a 22 y.o. G2P1001 at 2140w0d being seen today for ongoing prenatal care.  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  Patient reports no complaints.  Contractions: Irritability. Vag. Bleeding: None.  Movement: Present. Denies leaking of fluid.   The following portions of the patient's history were reviewed and updated as appropriate: allergies, current medications, past family history, past medical history, past social history, past surgical history and problem list. Problem list updated.  Objective:   Vitals:   03/28/18 1135  BP: 120/74  Pulse: 81  Weight: 219 lb 11.2 oz (99.7 kg)    Fetal Status: Fetal Heart Rate (bpm): 142 Fundal Height: 28 cm Movement: Present     General:  Alert, oriented and cooperative. Patient is in no acute distress.  Skin: Skin is warm and dry. No rash noted.   Cardiovascular: Normal heart rate noted  Respiratory: Normal respiratory effort, no problems with respiration noted  Abdomen: Soft, gravid, appropriate for gestational age.  Pain/Pressure: Present     Pelvic: Cervical exam deferred        Extremities: Normal range of motion.  Edema: Trace  Mental Status: Normal mood and affect. Normal behavior. Normal judgment and thought content.   Assessment and Plan:  Pregnancy: G2P1001 at 6840w0d  1. Supervision of high risk pregnancy, antepartum Patient is doing well without complaints Rhogam today Patient plans BTL- forms signed today  2. 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 Obesity in pregnancy   4. Rh  negative state in antepartum period Rhogam today  Preterm labor symptoms and general obstetric precautions including but not limited to vaginal bleeding, contractions, leaking of fluid and fetal movement were reviewed in detail with the patient. Please refer to After Visit Summary for other counseling recommendations.  Return in about 2 weeks (around 04/11/2018) for ROB.  Future Appointments  Date Time Provider Department Center  03/28/2018  1:15 PM [facility] US 4 [facility]    Catalina AntiguaPeggy Taylin Leder, MD

## 2018-03-28 NOTE — Addendum Note (Signed)
Addended by: Hamilton CapriBURCH, ARIEL J on: 03/28/2018 01:09 PM   Modules accepted: Orders

## 2018-04-04 ENCOUNTER — Ambulatory Visit (HOSPITAL_COMMUNITY)
Admission: RE | Admit: 2018-04-04 | Discharge: 2018-04-04 | Disposition: A | Discharge status: Home / Self Care | Payer: BLUE CROSS/BLUE SHIELD | Source: Ambulatory Visit | Attending: Obstetrics and Gynecology | Admitting: Obstetrics and Gynecology

## 2018-04-04 ENCOUNTER — Other Ambulatory Visit: Payer: Self-pay | Admitting: Obstetrics and Gynecology

## 2018-04-04 DIAGNOSIS — O26613 Liver and biliary tract disorders in pregnancy, third trimester: Principal | ICD-10-CM

## 2018-04-04 DIAGNOSIS — O26612 Liver and biliary tract disorders in pregnancy, second trimester: Secondary | ICD-10-CM

## 2018-04-04 DIAGNOSIS — O99213 Obesity complicating pregnancy, third trimester: Secondary | ICD-10-CM

## 2018-04-04 DIAGNOSIS — Z3A29 29 weeks gestation of pregnancy: Secondary | ICD-10-CM

## 2018-04-04 DIAGNOSIS — K831 Obstruction of bile duct: Secondary | ICD-10-CM

## 2018-04-11 ENCOUNTER — Encounter: Payer: Self-pay | Admitting: Obstetrics and Gynecology

## 2018-04-11 ENCOUNTER — Ambulatory Visit (INDEPENDENT_AMBULATORY_CARE_PROVIDER_SITE_OTHER): Payer: BLUE CROSS/BLUE SHIELD | Admitting: Obstetrics and Gynecology

## 2018-04-11 ENCOUNTER — Ambulatory Visit (HOSPITAL_COMMUNITY)
Admission: RE | Admit: 2018-04-11 | Discharge: 2018-04-11 | Disposition: A | Discharge status: Home / Self Care | Payer: BLUE CROSS/BLUE SHIELD | Source: Ambulatory Visit | Attending: Obstetrics and Gynecology | Admitting: Obstetrics and Gynecology

## 2018-04-11 ENCOUNTER — Other Ambulatory Visit (HOSPITAL_COMMUNITY): Payer: Self-pay | Admitting: *Deleted

## 2018-04-11 ENCOUNTER — Other Ambulatory Visit: Payer: Self-pay | Admitting: Obstetrics and Gynecology

## 2018-04-11 VITALS — BP 98/64 | HR 70 | Wt 224.1 lb

## 2018-04-11 DIAGNOSIS — O26612 Liver and biliary tract disorders in pregnancy, second trimester: Secondary | ICD-10-CM | POA: Diagnosis not present

## 2018-04-11 DIAGNOSIS — Z6791 Unspecified blood type, Rh negative: Secondary | ICD-10-CM

## 2018-04-11 DIAGNOSIS — O99213 Obesity complicating pregnancy, third trimester: Secondary | ICD-10-CM

## 2018-04-11 DIAGNOSIS — K831 Obstruction of bile duct: Secondary | ICD-10-CM

## 2018-04-11 DIAGNOSIS — O0990 Supervision of high risk pregnancy, unspecified, unspecified trimester: Secondary | ICD-10-CM

## 2018-04-11 DIAGNOSIS — Z3A30 30 weeks gestation of pregnancy: Secondary | ICD-10-CM

## 2018-04-11 DIAGNOSIS — O26899 Other specified pregnancy related conditions, unspecified trimester: Secondary | ICD-10-CM

## 2018-04-11 DIAGNOSIS — Z3009 Encounter for other general counseling and advice on contraception: Secondary | ICD-10-CM

## 2018-04-11 DIAGNOSIS — O26613 Liver and biliary tract disorders in pregnancy, third trimester: Secondary | ICD-10-CM | POA: Diagnosis present

## 2018-04-11 DIAGNOSIS — O26619 Liver and biliary tract disorders in pregnancy, unspecified trimester: Principal | ICD-10-CM

## 2018-04-11 DIAGNOSIS — O99712 Diseases of the skin and subcutaneous tissue complicating pregnancy, second trimester: Secondary | ICD-10-CM

## 2018-04-11 DIAGNOSIS — O26642 Intrahepatic cholestasis of pregnancy, second trimester: Secondary | ICD-10-CM

## 2018-04-11 DIAGNOSIS — L282 Other prurigo: Secondary | ICD-10-CM

## 2018-04-11 MED ORDER — TRIAMCINOLONE ACETONIDE 0.5 % EX OINT: 1.0000 "application " | TOPICAL_OINTMENT | Freq: Two times a day (BID) | CUTANEOUS | 2 refills | Status: DC

## 2018-04-11 MED ORDER — CLOTRIMAZOLE 1 % VA CREA: 1.0000 | TOPICAL_CREAM | Freq: Every day | VAGINAL | 0 refills | Status: DC

## 2018-04-11 MED ORDER — URSODIOL 250 MG PO TABS: 250.0000 mg | ORAL_TABLET | Freq: Three times a day (TID) | ORAL | 5 refills | Status: DC

## 2018-04-11 NOTE — Patient Instructions (Signed)
Third Trimester of Pregnancy The third trimester is from week 28 through week 40 (months 7 through 9). The third trimester is a time when the unborn baby (fetus) is growing rapidly. At the end of the ninth month, the fetus is about 20 inches in length and weighs 6-10 pounds. Body changes during your third trimester Your body will continue to go through many changes during pregnancy. The changes vary from woman to woman. During the third trimester:  Your weight will continue to increase. You can expect to gain 25-35 pounds (11-16 kg) by the end of the pregnancy.  You may begin to get stretch marks on your hips, abdomen, and breasts.  You may urinate more often because the fetus is moving lower into your pelvis and pressing on your bladder.  You may develop or continue to have heartburn. This is caused by increased hormones that slow down muscles in the digestive tract.  You may develop or continue to have constipation because increased hormones slow digestion and cause the muscles that push waste through your intestines to relax.  You may develop hemorrhoids. These are swollen veins (varicose veins) in the rectum that can itch or be painful.  You may develop swollen, bulging veins (varicose veins) in your legs.  You may have increased body aches in the pelvis, back, or thighs. This is due to weight gain and increased hormones that are relaxing your joints.  You may have changes in your hair. These can include thickening of your hair, rapid growth, and changes in texture. Some women also have hair loss during or after pregnancy, or hair that feels dry or thin. Your hair will most likely return to normal after your baby is born.  Your breasts will continue to grow and they will continue to become tender. A yellow fluid (colostrum) may leak from your breasts. This is the first milk you are producing for your baby.  Your belly button may stick out.  You may notice more swelling in your hands,  face, or ankles.  You may have increased tingling or numbness in your hands, arms, and legs. The skin on your belly may also feel numb.  You may feel short of breath because of your expanding uterus.  You may have more problems sleeping. This can be caused by the size of your belly, increased need to urinate, and an increase in your body's metabolism.  You may notice the fetus "dropping," or moving lower in your abdomen (lightening).  You may have increased vaginal discharge.  You may notice your joints feel loose and you may have pain around your pelvic bone.  What to expect at prenatal visits You will have prenatal exams every 2 weeks until week 36. Then you will have weekly prenatal exams. During a routine prenatal visit:  You will be weighed to make sure you and the baby are growing normally.  Your blood pressure will be taken.  Your abdomen will be measured to track your baby's growth.  The fetal heartbeat will be listened to.  Any test results from the previous visit will be discussed.  You may have a cervical check near your due date to see if your cervix has softened or thinned (effaced).  You will be tested for Group B streptococcus. This happens between 35 and 37 weeks.  Your health care provider may ask you:  What your birth plan is.  How you are feeling.  If you are feeling the baby move.  If you have had   any abnormal symptoms, such as leaking fluid, bleeding, severe headaches, or abdominal cramping.  If you are using any tobacco products, including cigarettes, chewing tobacco, and electronic cigarettes.  If you have any questions.  Other tests or screenings that may be performed during your third trimester include:  Blood tests that check for low iron levels (anemia).  Fetal testing to check the health, activity level, and growth of the fetus. Testing is done if you have certain medical conditions or if there are problems during the  pregnancy.  Nonstress test (NST). This test checks the health of your baby to make sure there are no signs of problems, such as the baby not getting enough oxygen. During this test, a belt is placed around your belly. The baby is made to move, and its heart rate is monitored during movement.  What is false labor? False labor is a condition in which you feel small, irregular tightenings of the muscles in the womb (contractions) that usually go away with rest, changing position, or drinking water. These are called Braxton Hicks contractions. Contractions may last for hours, days, or even weeks before true labor sets in. If contractions come at regular intervals, become more frequent, increase in intensity, or become painful, you should see your health care provider. What are the signs of labor?  Abdominal cramps.  Regular contractions that start at 10 minutes apart and become stronger and more frequent with time.  Contractions that start on the top of the uterus and spread down to the lower abdomen and back.  Increased pelvic pressure and dull back pain.  A watery or bloody mucus discharge that comes from the vagina.  Leaking of amniotic fluid. This is also known as your "water breaking." It could be a slow trickle or a gush. Let your health care provider know if it has a color or strange odor. If you have any of these signs, call your health care provider right away, even if it is before your due date. Follow these instructions at home: Medicines  Follow your health care provider's instructions regarding medicine use. Specific medicines may be either safe or unsafe to take during pregnancy.  Take a prenatal vitamin that contains at least 600 micrograms (mcg) of folic acid.  If you develop constipation, try taking a stool softener if your health care provider approves. Eating and drinking  Eat a balanced diet that includes fresh fruits and vegetables, whole grains, good sources of protein  such as meat, eggs, or tofu, and low-fat dairy. Your health care provider will help you determine the amount of weight gain that is right for you.  Avoid raw meat and uncooked cheese. These carry germs that can cause birth defects in the baby.  If you have low calcium intake from food, talk to your health care provider about whether you should take a daily calcium supplement.  Eat four or five small meals rather than three large meals a day.  Limit foods that are high in fat and processed sugars, such as fried and sweet foods.  To prevent constipation: ? Drink enough fluid to keep your urine clear or pale yellow. ? Eat foods that are high in fiber, such as fresh fruits and vegetables, whole grains, and beans. Activity  Exercise only as directed by your health care provider. Most women can continue their usual exercise routine during pregnancy. Try to exercise for 30 minutes at least 5 days a week. Stop exercising if you experience uterine contractions.  Avoid heavy   lifting.  Do not exercise in extreme heat or humidity, or at high altitudes.  Wear low-heel, comfortable shoes.  Practice good posture.  You may continue to have sex unless your health care provider tells you otherwise. Relieving pain and discomfort  Take frequent breaks and rest with your legs elevated if you have leg cramps or low back pain.  Take warm sitz baths to soothe any pain or discomfort caused by hemorrhoids. Use hemorrhoid cream if your health care provider approves.  Wear a good support bra to prevent discomfort from breast tenderness.  If you develop varicose veins: ? Wear support pantyhose or compression stockings as told by your healthcare provider. ? Elevate your feet for 15 minutes, 3-4 times a day. Prenatal care  Write down your questions. Take them to your prenatal visits.  Keep all your prenatal visits as told by your health care provider. This is important. Safety  Wear your seat belt at  all times when driving.  Make a list of emergency phone numbers, including numbers for family, friends, the hospital, and police and fire departments. General instructions  Avoid cat litter boxes and soil used by cats. These carry germs that can cause birth defects in the baby. If you have a cat, ask someone to clean the litter box for you.  Do not travel far distances unless it is absolutely necessary and only with the approval of your health care provider.  Do not use hot tubs, steam rooms, or saunas.  Do not drink alcohol.  Do not use any products that contain nicotine or tobacco, such as cigarettes and e-cigarettes. If you need help quitting, ask your health care provider.  Do not use any medicinal herbs or unprescribed drugs. These chemicals affect the formation and growth of the baby.  Do not douche or use tampons or scented sanitary pads.  Do not cross your legs for long periods of time.  To prepare for the arrival of your baby: ? Take prenatal classes to understand, practice, and ask questions about labor and delivery. ? Make a trial run to the hospital. ? Visit the hospital and tour the maternity area. ? Arrange for maternity or paternity leave through employers. ? Arrange for family and friends to take care of pets while you are in the hospital. ? Purchase a rear-facing car seat and make sure you know how to install it in your car. ? Pack your hospital bag. ? Prepare the baby's nursery. Make sure to remove all pillows and stuffed animals from the baby's crib to prevent suffocation.  Visit your dentist if you have not gone during your pregnancy. Use a soft toothbrush to brush your teeth and be gentle when you floss. Contact a health care provider if:  You are unsure if you are in labor or if your water has broken.  You become dizzy.  You have mild pelvic cramps, pelvic pressure, or nagging pain in your abdominal area.  You have lower back pain.  You have persistent  nausea, vomiting, or diarrhea.  You have an unusual or bad smelling vaginal discharge.  You have pain when you urinate. Get help right away if:  Your water breaks before 37 weeks.  You have regular contractions less than 5 minutes apart before 37 weeks.  You have a fever.  You are leaking fluid from your vagina.  You have spotting or bleeding from your vagina.  You have severe abdominal pain or cramping.  You have rapid weight loss or weight gain.    You have shortness of breath with chest pain.  You notice sudden or extreme swelling of your face, hands, ankles, feet, or legs.  Your baby makes fewer than 10 movements in 2 hours.  You have severe headaches that do not go away when you take medicine.  You have vision changes. Summary  The third trimester is from week 28 through week 40, months 7 through 9. The third trimester is a time when the unborn baby (fetus) is growing rapidly.  During the third trimester, your discomfort may increase as you and your baby continue to gain weight. You may have abdominal, leg, and back pain, sleeping problems, and an increased need to urinate.  During the third trimester your breasts will keep growing and they will continue to become tender. A yellow fluid (colostrum) may leak from your breasts. This is the first milk you are producing for your baby.  False labor is a condition in which you feel small, irregular tightenings of the muscles in the womb (contractions) that eventually go away. These are called Braxton Hicks contractions. Contractions may last for hours, days, or even weeks before true labor sets in.  Signs of labor can include: abdominal cramps; regular contractions that start at 10 minutes apart and become stronger and more frequent with time; watery or bloody mucus discharge that comes from the vagina; increased pelvic pressure and dull back pain; and leaking of amniotic fluid. This information is not intended to replace advice  given to you by your health care provider. Make sure you discuss any questions you have with your health care provider. Document Released: 09/01/2001 Document Revised: 02/13/2016 Document Reviewed: 11/08/2012 Elsevier Interactive Patient Education  2017 [employer]  

## 2018-04-11 NOTE — Progress Notes (Signed)
Subjective:  Rachel BurowOlga Rencher is a 22 y.o. G2P1001 at 4572w0d being seen today for ongoing prenatal care.  She is currently monitored for the following issues for this high-risk pregnancy and has Supervision of high risk pregnancy, antepartum; Rh negative state in antepartum period; Obesity in pregnancy; Vitamin D deficiency; Cholestasis of pregnancy in second trimester; and Unwanted fertility on their problem list.  Patient reports some itching. Out of her Actigall.  Contractions: Not present. Vag. Bleeding: None.  Movement: Present. Denies leaking of fluid.   The following portions of the patient's history were reviewed and updated as appropriate: allergies, current medications, past family history, past medical history, past social history, past surgical history and problem list. Problem list updated.  Objective:   Vitals:   04/11/18 1131  BP: 98/64  Pulse: 70  Weight: 224 lb 1.6 oz (101.7 kg)    Fetal Status: Fetal Heart Rate (bpm): 150   Movement: Present     General:  Alert, oriented and cooperative. Patient is in no acute distress.  Skin: Skin is warm and dry. No rash noted.   Cardiovascular: Normal heart rate noted  Respiratory: Normal respiratory effort, no problems with respiration noted  Abdomen: Soft, gravid, appropriate for gestational age. Pain/Pressure: Present     Pelvic:  Cervical exam deferred        Extremities: Normal range of motion.  Edema: Trace  Mental Status: Normal mood and affect. Normal behavior. Normal judgment and thought content.   Urinalysis:      Assessment and Plan:  Pregnancy: G2P1001 at 972w0d  1. Supervision of high risk pregnancy, antepartum Stable  2. Cholestasis of pregnancy in second trimester Weekly BBP without NST's til 37 weeks Start weekly NST's with next OB appt - ursodiol (ACTIGALL) 250 MG tablet; Take 1 tablet (250 mg total) by mouth 3 (three) times daily.  Dispense: 90 tablet; Refill: 5  3. Rh negative state in antepartum period   4.  Unwanted fertility BTL papers signed today   Preterm labor symptoms and general obstetric precautions including but not limited to vaginal bleeding, contractions, leaking of fluid and fetal movement were reviewed in detail with the patient. Please refer to After Visit Summary for other counseling recommendations.  Return in about 2 weeks (around 04/25/2018).   Hermina StaggersErvin, Lashena Signer L, MD

## 2018-04-18 ENCOUNTER — Ambulatory Visit (HOSPITAL_COMMUNITY)
Admission: RE | Admit: 2018-04-18 | Discharge: 2018-04-18 | Disposition: A | Discharge status: Home / Self Care | Payer: BLUE CROSS/BLUE SHIELD | Source: Ambulatory Visit | Attending: Obstetrics and Gynecology | Admitting: Obstetrics and Gynecology

## 2018-04-18 ENCOUNTER — Other Ambulatory Visit (HOSPITAL_COMMUNITY): Payer: Self-pay | Admitting: Obstetrics and Gynecology

## 2018-04-18 DIAGNOSIS — K831 Obstruction of bile duct: Secondary | ICD-10-CM | POA: Insufficient documentation

## 2018-04-18 DIAGNOSIS — Z3A31 31 weeks gestation of pregnancy: Secondary | ICD-10-CM | POA: Insufficient documentation

## 2018-04-18 DIAGNOSIS — O26619 Liver and biliary tract disorders in pregnancy, unspecified trimester: Secondary | ICD-10-CM

## 2018-04-18 DIAGNOSIS — O99213 Obesity complicating pregnancy, third trimester: Secondary | ICD-10-CM | POA: Insufficient documentation

## 2018-04-18 DIAGNOSIS — O26613 Liver and biliary tract disorders in pregnancy, third trimester: Secondary | ICD-10-CM | POA: Insufficient documentation

## 2018-04-25 ENCOUNTER — Ambulatory Visit (HOSPITAL_COMMUNITY): Payer: BLUE CROSS/BLUE SHIELD | Attending: Obstetrics and Gynecology

## 2018-04-28 ENCOUNTER — Encounter: Payer: Self-pay | Admitting: Obstetrics and Gynecology

## 2018-04-28 ENCOUNTER — Ambulatory Visit (INDEPENDENT_AMBULATORY_CARE_PROVIDER_SITE_OTHER): Payer: BLUE CROSS/BLUE SHIELD | Admitting: Obstetrics and Gynecology

## 2018-04-28 VITALS — BP 123/71 | HR 89 | Wt 227.0 lb

## 2018-04-28 DIAGNOSIS — O26899 Other specified pregnancy related conditions, unspecified trimester: Secondary | ICD-10-CM

## 2018-04-28 DIAGNOSIS — O0990 Supervision of high risk pregnancy, unspecified, unspecified trimester: Secondary | ICD-10-CM

## 2018-04-28 DIAGNOSIS — K831 Obstruction of bile duct: Secondary | ICD-10-CM

## 2018-04-28 DIAGNOSIS — O26612 Liver and biliary tract disorders in pregnancy, second trimester: Secondary | ICD-10-CM

## 2018-04-28 DIAGNOSIS — O99210 Obesity complicating pregnancy, unspecified trimester: Secondary | ICD-10-CM

## 2018-04-28 DIAGNOSIS — Z6791 Unspecified blood type, Rh negative: Secondary | ICD-10-CM

## 2018-04-28 NOTE — Progress Notes (Signed)
   PRENATAL VISIT NOTE  Subjective:  Rachel Campos is a 22 y.o. G2P1001 at 5526w3d being seen today for ongoing prenatal care.  She is currently monitored for the following issues for this high-risk pregnancy and has Supervision of high risk pregnancy, antepartum; Rh negative state in antepartum period; Obesity in pregnancy; Vitamin D deficiency; Cholestasis of pregnancy in second trimester; and Unwanted fertility on their problem list.  Patient reports no complaints.  Contractions: Irregular. Vag. Bleeding: None.  Movement: Present. Denies leaking of fluid.   The following portions of the patient's history were reviewed and updated as appropriate: allergies, current medications, past family history, past medical history, past social history, past surgical history and problem list. Problem list updated.  Objective:   Vitals:   04/28/18 1041  BP: 123/71  Pulse: 89  Weight: 227 lb (103 kg)    Fetal Status: Fetal Heart Rate (bpm): NST   Movement: Present     General:  Alert, oriented and cooperative. Patient is in no acute distress.  Skin: Skin is warm and dry. No rash noted.   Cardiovascular: Normal heart rate noted  Respiratory: Normal respiratory effort, no problems with respiration noted  Abdomen: Soft, gravid, appropriate for gestational age.  Pain/Pressure: Present     Pelvic: Cervical exam deferred        Extremities: Normal range of motion.  Edema: Trace  Mental Status: Normal mood and affect. Normal behavior. Normal judgment and thought content.   Assessment and Plan:  Pregnancy: G2P1001 at 6026w3d  1. Supervision of high risk pregnancy, antepartum Patient is doing well without complaints  2. Cholestasis of pregnancy in second trimester Continue Actigall NST reviewed and reactive with baseline 135, mod variability, + accels, no decels Continue antenatal testing  Plan for IOL at 37 weeks  3. Rh negative state in antepartum period S/p rhogam  4. Obesity in  pregnancy   Preterm labor symptoms and general obstetric precautions including but not limited to vaginal bleeding, contractions, leaking of fluid and fetal movement were reviewed in detail with the patient. Please refer to After Visit Summary for other counseling recommendations.  No follow-ups on file.  Future Appointments  Date Time Provider Department Center  05/02/2018  1:45 PM [facility] US 2 [facility]    Catalina AntiguaPeggy Makinsley Schiavi, MD

## 2018-04-28 NOTE — Progress Notes (Signed)
Patient reports good fetal movement with some pressure and irregular contractions. 

## 2018-05-02 ENCOUNTER — Other Ambulatory Visit (HOSPITAL_COMMUNITY): Payer: Self-pay | Admitting: Obstetrics and Gynecology

## 2018-05-02 ENCOUNTER — Ambulatory Visit (HOSPITAL_COMMUNITY)
Admission: RE | Admit: 2018-05-02 | Discharge: 2018-05-02 | Disposition: A | Discharge status: Home / Self Care | Payer: BLUE CROSS/BLUE SHIELD | Source: Ambulatory Visit | Attending: Obstetrics and Gynecology | Admitting: Obstetrics and Gynecology

## 2018-05-02 DIAGNOSIS — O99213 Obesity complicating pregnancy, third trimester: Secondary | ICD-10-CM | POA: Diagnosis not present

## 2018-05-02 DIAGNOSIS — O26613 Liver and biliary tract disorders in pregnancy, third trimester: Secondary | ICD-10-CM | POA: Insufficient documentation

## 2018-05-02 DIAGNOSIS — E669 Obesity, unspecified: Secondary | ICD-10-CM | POA: Diagnosis not present

## 2018-05-02 DIAGNOSIS — O26619 Liver and biliary tract disorders in pregnancy, unspecified trimester: Principal | ICD-10-CM

## 2018-05-02 DIAGNOSIS — K831 Obstruction of bile duct: Secondary | ICD-10-CM

## 2018-05-02 DIAGNOSIS — Z3A33 33 weeks gestation of pregnancy: Secondary | ICD-10-CM | POA: Diagnosis not present

## 2018-05-02 DIAGNOSIS — Z362 Encounter for other antenatal screening follow-up: Secondary | ICD-10-CM | POA: Diagnosis present

## 2018-05-05 ENCOUNTER — Ambulatory Visit (INDEPENDENT_AMBULATORY_CARE_PROVIDER_SITE_OTHER): Payer: BLUE CROSS/BLUE SHIELD | Admitting: Obstetrics and Gynecology

## 2018-05-05 ENCOUNTER — Encounter: Payer: Self-pay | Admitting: Obstetrics and Gynecology

## 2018-05-05 VITALS — BP 116/74 | HR 97 | Wt 227.8 lb

## 2018-05-05 DIAGNOSIS — Z3009 Encounter for other general counseling and advice on contraception: Secondary | ICD-10-CM

## 2018-05-05 DIAGNOSIS — K831 Obstruction of bile duct: Secondary | ICD-10-CM

## 2018-05-05 DIAGNOSIS — O26899 Other specified pregnancy related conditions, unspecified trimester: Secondary | ICD-10-CM

## 2018-05-05 DIAGNOSIS — Z6791 Unspecified blood type, Rh negative: Secondary | ICD-10-CM

## 2018-05-05 DIAGNOSIS — O26612 Liver and biliary tract disorders in pregnancy, second trimester: Secondary | ICD-10-CM

## 2018-05-05 DIAGNOSIS — O0990 Supervision of high risk pregnancy, unspecified, unspecified trimester: Secondary | ICD-10-CM

## 2018-05-05 NOTE — Progress Notes (Signed)
Subjective:  Rachel BurowOlga Campos is a 22 y.o. G2P1001 at 8362w3d being seen today for ongoing prenatal care.  She is currently monitored for the following issues for this high-risk pregnancy and has Supervision of high risk pregnancy, antepartum; Rh negative state in antepartum period; Obesity in pregnancy; Vitamin D deficiency; Cholestasis of pregnancy in second trimester; and Unwanted fertility on their problem list.  Patient reports no complaints.  Contractions: Not present. Vag. Bleeding: None.  Movement: Present. Denies leaking of fluid.   The following portions of the patient's history were reviewed and updated as appropriate: allergies, current medications, past family history, past medical history, past social history, past surgical history and problem list. Problem list updated.  Objective:   Vitals:   05/05/18 1105  BP: 116/74  Pulse: 97  Weight: 227 lb 12.8 oz (103.3 kg)    Fetal Status: Fetal Heart Rate (bpm): NST   Movement: Present     General:  Alert, oriented and cooperative. Patient is in no acute distress.  Skin: Skin is warm and dry. No rash noted.   Cardiovascular: Normal heart rate noted  Respiratory: Normal respiratory effort, no problems with respiration noted  Abdomen: Soft, gravid, appropriate for gestational age. Pain/Pressure: Present     Pelvic:  Cervical exam deferred        Extremities: Normal range of motion.  Edema: Trace  Mental Status: Normal mood and affect. Normal behavior. Normal judgment and thought content.   Urinalysis:      Assessment and Plan:  Pregnancy: G2P1001 at 4562w3d  1. Supervision of high risk pregnancy, antepartum Stable - Fetal nonstress test; Future  2. Cholestasis of pregnancy in second trimester BPP 8/8 0n 05/02/18 Reactive NST today Continue with antenatal testing Growth scan next week IOL at 37 weeks   3. Rh negative state in antepartum period S/P Rhogam  4. Unwanted fertility BTL papers signed  Preterm labor symptoms and  general obstetric precautions including but not limited to vaginal bleeding, contractions, leaking of fluid and fetal movement were reviewed in detail with the patient. Please refer to After Visit Summary for other counseling recommendations.  Return in about 1 week (around 05/12/2018) for OB visit.   Hermina StaggersErvin, Filimon Miranda L, MD

## 2018-05-05 NOTE — Patient Instructions (Signed)
Breastfeeding Choosing to breastfeed is one of the best decisions you can make for yourself and your baby. A change in hormones during pregnancy causes your breasts to make breast milk in your milk-producing glands. Hormones prevent breast milk from being released before your baby is born. They also prompt milk flow after birth. Once breastfeeding has begun, thoughts of your baby, as well as his or her sucking or crying, can stimulate the release of milk from your milk-producing glands. Benefits of breastfeeding Research shows that breastfeeding offers many health benefits for infants and mothers. It also offers a cost-free and convenient way to feed your baby. For your baby  Your first milk (colostrum) helps your baby's digestive system to function better.  Special cells in your milk (antibodies) help your baby to fight off infections.  Breastfed babies are less likely to develop asthma, allergies, obesity, or type 2 diabetes. They are also at lower risk for sudden infant death syndrome (SIDS).  Nutrients in breast milk are better able to meet your baby's needs compared to infant formula.  Breast milk improves your baby's brain development. For you  Breastfeeding helps to create a very special bond between you and your baby.  Breastfeeding is convenient. Breast milk costs nothing and is always available at the correct temperature.  Breastfeeding helps to burn calories. It helps you to lose the weight that you gained during pregnancy.  Breastfeeding makes your uterus return faster to its size before pregnancy. It also slows bleeding (lochia) after you give birth.  Breastfeeding helps to lower your risk of developing type 2 diabetes, osteoporosis, rheumatoid arthritis, cardiovascular disease, and breast, ovarian, uterine, and endometrial cancer later in life. Breastfeeding basics Starting breastfeeding  Find a comfortable place to sit or lie down, with your neck and back  well-supported.  Place a pillow or a rolled-up blanket under your baby to bring him or her to the level of your breast (if you are seated). Nursing pillows are specially designed to help support your arms and your baby while you breastfeed.  Make sure that your baby's tummy (abdomen) is facing your abdomen.  Gently massage your breast. With your fingertips, massage from the outer edges of your breast inward toward the nipple. This encourages milk flow. If your milk flows slowly, you may need to continue this action during the feeding.  Support your breast with 4 fingers underneath and your thumb above your nipple (make the letter "C" with your hand). Make sure your fingers are well away from your nipple and your baby's mouth.  Stroke your baby's lips gently with your finger or nipple.  When your baby's mouth is open wide enough, quickly bring your baby to your breast, placing your entire nipple and as much of the areola as possible into your baby's mouth. The areola is the colored area around your nipple. ? More areola should be visible above your baby's upper lip than below the lower lip. ? Your baby's lips should be opened and extended outward (flanged) to ensure an adequate, comfortable latch. ? Your baby's tongue should be between his or her lower gum and your breast.  Make sure that your baby's mouth is correctly positioned around your nipple (latched). Your baby's lips should create a seal on your breast and be turned out (everted).  It is common for your baby to suck about 2-3 minutes in order to start the flow of breast milk. Latching Teaching your baby how to latch onto your breast properly is very   important. An improper latch can cause nipple pain, decreased milk supply, and poor weight gain in your baby. Also, if your baby is not latched onto your nipple properly, he or she may swallow some air during feeding. This can make your baby fussy. Burping your baby when you switch breasts  during the feeding can help to get rid of the air. However, teaching your baby to latch on properly is still the best way to prevent fussiness from swallowing air while breastfeeding. Signs that your baby has successfully latched onto your nipple  Silent tugging or silent sucking, without causing you pain. Infant's lips should be extended outward (flanged).  Swallowing heard between every 3-4 sucks once your milk has started to flow (after your let-down milk reflex occurs).  Muscle movement above and in front of his or her ears while sucking.  Signs that your baby has not successfully latched onto your nipple  Sucking sounds or smacking sounds from your baby while breastfeeding.  Nipple pain.  If you think your baby has not latched on correctly, slip your finger into the corner of your baby's mouth to break the suction and place it between your baby's gums. Attempt to start breastfeeding again. Signs of successful breastfeeding Signs from your baby  Your baby will gradually decrease the number of sucks or will completely stop sucking.  Your baby will fall asleep.  Your baby's body will relax.  Your baby will retain a small amount of milk in his or her mouth.  Your baby will let go of your breast by himself or herself.  Signs from you  Breasts that have increased in firmness, weight, and size 1-3 hours after feeding.  Breasts that are softer immediately after breastfeeding.  Increased milk volume, as well as a change in milk consistency and color by the fifth day of breastfeeding.  Nipples that are not sore, cracked, or bleeding.  Signs that your baby is getting enough milk  Wetting at least 1-2 diapers during the first 24 hours after birth.  Wetting at least 5-6 diapers every 24 hours for the first week after birth. The urine should be clear or pale yellow by the age of 5 days.  Wetting 6-8 diapers every 24 hours as your baby continues to grow and develop.  At least 3  stools in a 24-hour period by the age of 5 days. The stool should be soft and yellow.  At least 3 stools in a 24-hour period by the age of 7 days. The stool should be seedy and yellow.  No loss of weight greater than 10% of birth weight during the first 3 days of life.  Average weight gain of 4-7 oz (113-198 g) per week after the age of 4 days.  Consistent daily weight gain by the age of 5 days, without weight loss after the age of 2 weeks. After a feeding, your baby may spit up a small amount of milk. This is normal. Breastfeeding frequency and duration Frequent feeding will help you make more milk and can prevent sore nipples and extremely full breasts (breast engorgement). Breastfeed when you feel the need to reduce the fullness of your breasts or when your baby shows signs of hunger. This is called "breastfeeding on demand." Signs that your baby is hungry include:  Increased alertness, activity, or restlessness.  Movement of the head from side to side.  Opening of the mouth when the corner of the mouth or cheek is stroked (rooting).  Increased sucking sounds,   smacking lips, cooing, sighing, or squeaking.  Hand-to-mouth movements and sucking on fingers or hands.  Fussing or crying.  Avoid introducing a pacifier to your baby in the first 4-6 weeks after your baby is born. After this time, you may choose to use a pacifier. Research has shown that pacifier use during the first year of a baby's life decreases the risk of sudden infant death syndrome (SIDS). Allow your baby to feed on each breast as long as he or she wants. When your baby unlatches or falls asleep while feeding from the first breast, offer the second breast. Because newborns are often sleepy in the first few weeks of life, you may need to awaken your baby to get him or her to feed. Breastfeeding times will vary from baby to baby. However, the following rules can serve as a guide to help you make sure that your baby is  properly fed:  Newborns (babies 4 weeks of age or younger) may breastfeed every 1-3 hours.  Newborns should not go without breastfeeding for longer than 3 hours during the day or 5 hours during the night.  You should breastfeed your baby a minimum of 8 times in a 24-hour period.  Breast milk pumping Pumping and storing breast milk allows you to make sure that your baby is exclusively fed your breast milk, even at times when you are unable to breastfeed. This is especially important if you go back to work while you are still breastfeeding, or if you are not able to be present during feedings. Your lactation consultant can help you find a method of pumping that works best for you and give you guidelines about how long it is safe to store breast milk. Caring for your breasts while you breastfeed Nipples can become dry, cracked, and sore while breastfeeding. The following recommendations can help keep your breasts moisturized and healthy:  Avoid using soap on your nipples.  Wear a supportive bra designed especially for nursing. Avoid wearing underwire-style bras or extremely tight bras (sports bras).  Air-dry your nipples for 3-4 minutes after each feeding.  Use only cotton bra pads to absorb leaked breast milk. Leaking of breast milk between feedings is normal.  Use lanolin on your nipples after breastfeeding. Lanolin helps to maintain your skin's normal moisture barrier. Pure lanolin is not harmful (not toxic) to your baby. You may also hand express a few drops of breast milk and gently massage that milk into your nipples and allow the milk to air-dry.  In the first few weeks after giving birth, some women experience breast engorgement. Engorgement can make your breasts feel heavy, warm, and tender to the touch. Engorgement peaks within 3-5 days after you give birth. The following recommendations can help to ease engorgement:  Completely empty your breasts while breastfeeding or pumping. You  may want to start by applying warm, moist heat (in the shower or with warm, water-soaked hand towels) just before feeding or pumping. This increases circulation and helps the milk flow. If your baby does not completely empty your breasts while breastfeeding, pump any extra milk after he or she is finished.  Apply ice packs to your breasts immediately after breastfeeding or pumping, unless this is too uncomfortable for you. To do this: ? Put ice in a plastic bag. ? Place a towel between your skin and the bag. ? Leave the ice on for 20 minutes, 2-3 times a day.  Make sure that your baby is latched on and positioned properly while   breastfeeding.  If engorgement persists after 48 hours of following these recommendations, contact your health care provider or a lactation consultant. Overall health care recommendations while breastfeeding  Eat 3 healthy meals and 3 snacks every day. Well-nourished mothers who are breastfeeding need an additional 450-500 calories a day. You can meet this requirement by increasing the amount of a balanced diet that you eat.  Drink enough water to keep your urine pale yellow or clear.  Rest often, relax, and continue to take your prenatal vitamins to prevent fatigue, stress, and low vitamin and mineral levels in your body (nutrient deficiencies).  Do not use any products that contain nicotine or tobacco, such as cigarettes and e-cigarettes. Your baby may be harmed by chemicals from cigarettes that pass into breast milk and exposure to secondhand smoke. If you need help quitting, ask your health care provider.  Avoid alcohol.  Do not use illegal drugs or marijuana.  Talk with your health care provider before taking any medicines. These include over-the-counter and prescription medicines as well as vitamins and herbal supplements. Some medicines that may be harmful to your baby can pass through breast milk.  It is possible to become pregnant while breastfeeding. If  birth control is desired, ask your health care provider about options that will be safe while breastfeeding your baby. Where to find more information: [employer]: www.llli.org Contact a health care provider if:  You feel like you want to stop breastfeeding or have become frustrated with breastfeeding.  Your nipples are cracked or bleeding.  Your breasts are red, tender, or warm.  You have: ? Painful breasts or nipples. ? A swollen area on either breast. ? A fever or chills. ? Nausea or vomiting. ? Drainage other than breast milk from your nipples.  Your breasts do not become full before feedings by the fifth day after you give birth.  You feel sad and depressed.  Your baby is: ? Too sleepy to eat well. ? Having trouble sleeping. ? More than 1 week old and wetting fewer than 6 diapers in a 24-hour period. ? Not gaining weight by 5 days of age.  Your baby has fewer than 3 stools in a 24-hour period.  Your baby's skin or the white parts of his or her eyes become yellow. Get help right away if:  Your baby is overly tired (lethargic) and does not want to wake up and feed.  Your baby develops an unexplained fever. Summary  Breastfeeding offers many health benefits for infant and mothers.  Try to breastfeed your infant when he or she shows early signs of hunger.  Gently tickle or stroke your baby's lips with your finger or nipple to allow the baby to open his or her mouth. Bring the baby to your breast. Make sure that much of the areola is in your baby's mouth. Offer one side and burp the baby before you offer the other side.  Talk with your health care provider or lactation consultant if you have questions or you face problems as you breastfeed. This information is not intended to replace advice given to you by your health care provider. Make sure you discuss any questions you have with your health care provider. Document Released: 09/07/2005 Document  Revised: 10/09/2016 Document Reviewed: 10/09/2016 [employer] Interactive Patient Education  2018 [employer] Third Trimester of Pregnancy The third trimester is from week 28 through week 40 (months 7 through 9). The third trimester is a time when the unborn baby (fetus)   is growing rapidly. At the end of the ninth month, the fetus is about 20 inches in length and weighs 6-10 pounds. Body changes during your third trimester Your body will continue to go through many changes during pregnancy. The changes vary from woman to woman. During the third trimester:  Your weight will continue to increase. You can expect to gain 25-35 pounds (11-16 kg) by the end of the pregnancy.  You may begin to get stretch marks on your hips, abdomen, and breasts.  You may urinate more often because the fetus is moving lower into your pelvis and pressing on your bladder.  You may develop or continue to have heartburn. This is caused by increased hormones that slow down muscles in the digestive tract.  You may develop or continue to have constipation because increased hormones slow digestion and cause the muscles that push waste through your intestines to relax.  You may develop hemorrhoids. These are swollen veins (varicose veins) in the rectum that can itch or be painful.  You may develop swollen, bulging veins (varicose veins) in your legs.  You may have increased body aches in the pelvis, back, or thighs. This is due to weight gain and increased hormones that are relaxing your joints.  You may have changes in your hair. These can include thickening of your hair, rapid growth, and changes in texture. Some women also have hair loss during or after pregnancy, or hair that feels dry or thin. Your hair will most likely return to normal after your baby is born.  Your breasts will continue to grow and they will continue to become tender. A yellow fluid (colostrum) may leak from your breasts. This is the first milk you  are producing for your baby.  Your belly button may stick out.  You may notice more swelling in your hands, face, or ankles.  You may have increased tingling or numbness in your hands, arms, and legs. The skin on your belly may also feel numb.  You may feel short of breath because of your expanding uterus.  You may have more problems sleeping. This can be caused by the size of your belly, increased need to urinate, and an increase in your body's metabolism.  You may notice the fetus "dropping," or moving lower in your abdomen (lightening).  You may have increased vaginal discharge.  You may notice your joints feel loose and you may have pain around your pelvic bone.  What to expect at prenatal visits You will have prenatal exams every 2 weeks until week 36. Then you will have weekly prenatal exams. During a routine prenatal visit:  You will be weighed to make sure you and the baby are growing normally.  Your blood pressure will be taken.  Your abdomen will be measured to track your baby's growth.  The fetal heartbeat will be listened to.  Any test results from the previous visit will be discussed.  You may have a cervical check near your due date to see if your cervix has softened or thinned (effaced).  You will be tested for Group B streptococcus. This happens between 35 and 37 weeks.  Your health care provider may ask you:  What your birth plan is.  How you are feeling.  If you are feeling the baby move.  If you have had any abnormal symptoms, such as leaking fluid, bleeding, severe headaches, or abdominal cramping.  If you are using any tobacco products, including cigarettes, chewing tobacco, and electronic cigarettes.    If you have any questions.  Other tests or screenings that may be performed during your third trimester include:  Blood tests that check for low iron levels (anemia).  Fetal testing to check the health, activity level, and growth of the fetus.  Testing is done if you have certain medical conditions or if there are problems during the pregnancy.  Nonstress test (NST). This test checks the health of your baby to make sure there are no signs of problems, such as the baby not getting enough oxygen. During this test, a belt is placed around your belly. The baby is made to move, and its heart rate is monitored during movement.  What is false labor? False labor is a condition in which you feel small, irregular tightenings of the muscles in the womb (contractions) that usually go away with rest, changing position, or drinking water. These are called Braxton Hicks contractions. Contractions may last for hours, days, or even weeks before true labor sets in. If contractions come at regular intervals, become more frequent, increase in intensity, or become painful, you should see your health care provider. What are the signs of labor?  Abdominal cramps.  Regular contractions that start at 10 minutes apart and become stronger and more frequent with time.  Contractions that start on the top of the uterus and spread down to the lower abdomen and back.  Increased pelvic pressure and dull back pain.  A watery or bloody mucus discharge that comes from the vagina.  Leaking of amniotic fluid. This is also known as your "water breaking." It could be a slow trickle or a gush. Let your health care provider know if it has a color or strange odor. If you have any of these signs, call your health care provider right away, even if it is before your due date. Follow these instructions at home: Medicines  Follow your health care provider's instructions regarding medicine use. Specific medicines may be either safe or unsafe to take during pregnancy.  Take a prenatal vitamin that contains at least 600 micrograms (mcg) of folic acid.  If you develop constipation, try taking a stool softener if your health care provider approves. Eating and drinking  Eat a  balanced diet that includes fresh fruits and vegetables, whole grains, good sources of protein such as meat, eggs, or tofu, and low-fat dairy. Your health care provider will help you determine the amount of weight gain that is right for you.  Avoid raw meat and uncooked cheese. These carry germs that can cause birth defects in the baby.  If you have low calcium intake from food, talk to your health care provider about whether you should take a daily calcium supplement.  Eat four or five small meals rather than three large meals a day.  Limit foods that are high in fat and processed sugars, such as fried and sweet foods.  To prevent constipation: ? Drink enough fluid to keep your urine clear or pale yellow. ? Eat foods that are high in fiber, such as fresh fruits and vegetables, whole grains, and beans. Activity  Exercise only as directed by your health care provider. Most women can continue their usual exercise routine during pregnancy. Try to exercise for 30 minutes at least 5 days a week. Stop exercising if you experience uterine contractions.  Avoid heavy lifting.  Do not exercise in extreme heat or humidity, or at high altitudes.  Wear low-heel, comfortable shoes.  Practice good posture.  You may continue to have   sex unless your health care provider tells you otherwise. Relieving pain and discomfort  Take frequent breaks and rest with your legs elevated if you have leg cramps or low back pain.  Take warm sitz baths to soothe any pain or discomfort caused by hemorrhoids. Use hemorrhoid cream if your health care provider approves.  Wear a good support bra to prevent discomfort from breast tenderness.  If you develop varicose veins: ? Wear support pantyhose or compression stockings as told by your healthcare provider. ? Elevate your feet for 15 minutes, 3-4 times a day. Prenatal care  Write down your questions. Take them to your prenatal visits.  Keep all your prenatal  visits as told by your health care provider. This is important. Safety  Wear your seat belt at all times when driving.  Make a list of emergency phone numbers, including numbers for family, friends, the hospital, and police and fire departments. General instructions  Avoid cat litter boxes and soil used by cats. These carry germs that can cause birth defects in the baby. If you have a cat, ask someone to clean the litter box for you.  Do not travel far distances unless it is absolutely necessary and only with the approval of your health care provider.  Do not use hot tubs, steam rooms, or saunas.  Do not drink alcohol.  Do not use any products that contain nicotine or tobacco, such as cigarettes and e-cigarettes. If you need help quitting, ask your health care provider.  Do not use any medicinal herbs or unprescribed drugs. These chemicals affect the formation and growth of the baby.  Do not douche or use tampons or scented sanitary pads.  Do not cross your legs for long periods of time.  To prepare for the arrival of your baby: ? Take prenatal classes to understand, practice, and ask questions about labor and delivery. ? Make a trial run to the hospital. ? Visit the hospital and tour the maternity area. ? Arrange for maternity or paternity leave through employers. ? Arrange for family and friends to take care of pets while you are in the hospital. ? Purchase a rear-facing car seat and make sure you know how to install it in your car. ? Pack your hospital bag. ? Prepare the baby's nursery. Make sure to remove all pillows and stuffed animals from the baby's crib to prevent suffocation.  Visit your dentist if you have not gone during your pregnancy. Use a soft toothbrush to brush your teeth and be gentle when you floss. Contact a health care provider if:  You are unsure if you are in labor or if your water has broken.  You become dizzy.  You have mild pelvic cramps, pelvic  pressure, or nagging pain in your abdominal area.  You have lower back pain.  You have persistent nausea, vomiting, or diarrhea.  You have an unusual or bad smelling vaginal discharge.  You have pain when you urinate. Get help right away if:  Your water breaks before 37 weeks.  You have regular contractions less than 5 minutes apart before 37 weeks.  You have a fever.  You are leaking fluid from your vagina.  You have spotting or bleeding from your vagina.  You have severe abdominal pain or cramping.  You have rapid weight loss or weight gain.  You have shortness of breath with chest pain.  You notice sudden or extreme swelling of your face, hands, ankles, feet, or legs.  Your baby makes fewer   than 10 movements in 2 hours.  You have severe headaches that do not go away when you take medicine.  You have vision changes. Summary  The third trimester is from week 28 through week 40, months 7 through 9. The third trimester is a time when the unborn baby (fetus) is growing rapidly.  During the third trimester, your discomfort may increase as you and your baby continue to gain weight. You may have abdominal, leg, and back pain, sleeping problems, and an increased need to urinate.  During the third trimester your breasts will keep growing and they will continue to become tender. A yellow fluid (colostrum) may leak from your breasts. This is the first milk you are producing for your baby.  False labor is a condition in which you feel small, irregular tightenings of the muscles in the womb (contractions) that eventually go away. These are called Braxton Hicks contractions. Contractions may last for hours, days, or even weeks before true labor sets in.  Signs of labor can include: abdominal cramps; regular contractions that start at 10 minutes apart and become stronger and more frequent with time; watery or bloody mucus discharge that comes from the vagina; increased pelvic pressure  and dull back pain; and leaking of amniotic fluid. This information is not intended to replace advice given to you by your health care provider. Make sure you discuss any questions you have with your health care provider. Document Released: 09/01/2001 Document Revised: 02/13/2016 Document Reviewed: 11/08/2012 Elsevier Interactive Patient Education  2017 Elsevier Inc.  

## 2018-05-09 ENCOUNTER — Ambulatory Visit (HOSPITAL_COMMUNITY): Payer: BLUE CROSS/BLUE SHIELD | Attending: Obstetrics and Gynecology

## 2018-05-12 ENCOUNTER — Encounter: Payer: Self-pay | Admitting: Obstetrics and Gynecology

## 2018-05-12 ENCOUNTER — Ambulatory Visit (INDEPENDENT_AMBULATORY_CARE_PROVIDER_SITE_OTHER): Payer: BLUE CROSS/BLUE SHIELD | Admitting: Obstetrics and Gynecology

## 2018-05-12 ENCOUNTER — Other Ambulatory Visit (HOSPITAL_COMMUNITY)
Admission: RE | Admit: 2018-05-12 | Discharge: 2018-05-12 | Disposition: A | Discharge status: Home / Self Care | Payer: BLUE CROSS/BLUE SHIELD | Source: Ambulatory Visit | Attending: Obstetrics and Gynecology | Admitting: Obstetrics and Gynecology

## 2018-05-12 VITALS — BP 108/70 | HR 80 | Wt 229.8 lb

## 2018-05-12 DIAGNOSIS — Z6791 Unspecified blood type, Rh negative: Secondary | ICD-10-CM

## 2018-05-12 DIAGNOSIS — N898 Other specified noninflammatory disorders of vagina: Secondary | ICD-10-CM | POA: Insufficient documentation

## 2018-05-12 DIAGNOSIS — O26642 Intrahepatic cholestasis of pregnancy, second trimester: Secondary | ICD-10-CM

## 2018-05-12 DIAGNOSIS — O26612 Liver and biliary tract disorders in pregnancy, second trimester: Secondary | ICD-10-CM

## 2018-05-12 DIAGNOSIS — K831 Obstruction of bile duct: Secondary | ICD-10-CM

## 2018-05-12 DIAGNOSIS — O0993 Supervision of high risk pregnancy, unspecified, third trimester: Secondary | ICD-10-CM | POA: Insufficient documentation

## 2018-05-12 DIAGNOSIS — O26899 Other specified pregnancy related conditions, unspecified trimester: Secondary | ICD-10-CM

## 2018-05-12 DIAGNOSIS — Z3009 Encounter for other general counseling and advice on contraception: Secondary | ICD-10-CM

## 2018-05-12 DIAGNOSIS — O0990 Supervision of high risk pregnancy, unspecified, unspecified trimester: Secondary | ICD-10-CM

## 2018-05-12 DIAGNOSIS — O99210 Obesity complicating pregnancy, unspecified trimester: Secondary | ICD-10-CM

## 2018-05-12 DIAGNOSIS — Z3A35 35 weeks gestation of pregnancy: Secondary | ICD-10-CM | POA: Diagnosis not present

## 2018-05-12 NOTE — Progress Notes (Signed)
   PRENATAL VISIT NOTE  Subjective:  Rachel Campos is a 22 y.o. G2P1001 at 4346w3d being seen today for ongoing prenatal care.  She is currently monitored for the following issues for this high-risk pregnancy and has Supervision of high risk pregnancy, antepartum; Rh negative state in antepartum period; Obesity in pregnancy; Vitamin D deficiency; Cholestasis of pregnancy in second trimester; and Unwanted fertility on their problem list.  Patient reports occasional contractions.  Contractions: Not present. Vag. Bleeding: None.  Movement: Present. Denies leaking of fluid.   The following portions of the patient's history were reviewed and updated as appropriate: allergies, current medications, past family history, past medical history, past social history, past surgical history and problem list. Problem list updated.  Objective:   Vitals:   05/12/18 1039  BP: 108/70  Pulse: 80  Weight: 229 lb 12.8 oz (104.2 kg)   Fetal Status: Fetal Heart Rate (bpm): NST   Movement: Present     General:  Alert, oriented and cooperative. Patient is in no acute distress.  Skin: Skin is warm and dry. No rash noted.   Cardiovascular: Normal heart rate noted  Respiratory: Normal respiratory effort, no problems with respiration noted  Abdomen: Soft, gravid, appropriate for gestational age.  Pain/Pressure: Absent     Pelvic: Cervical exam deferred        Extremities: Normal range of motion.  Edema: Trace  Mental Status: Normal mood and affect. Normal behavior. Normal judgment and thought content.   Assessment and Plan:  Pregnancy: G2P1001 at 6446w3d  1. Supervision of high risk pregnancy, antepartum     2. Obesity in pregnancy  3. Rh negative state in antepartum period  4. Cholestasis of pregnancy in second trimester Cont actigall BID - Fetal nonstress test NST reactive today Reviewed importance of attending weekly testing and potential adverse outcomes for fetus, she verbalizes understanding To MAU for  BPP  5. Unwanted fertility For BTL  6. Vaginal itching Swab today  Preterm labor symptoms and general obstetric precautions including but not limited to vaginal bleeding, contractions, leaking of fluid and fetal movement were reviewed in detail with the patient. Please refer to After Visit Summary for other counseling recommendations.  Return in about 2 weeks (around 05/26/2018) for OB visit.  Future Appointments  Date Time Provider Department Center  05/17/2018  9:15 AM [facility] US 4 [facility]  05/17/2018 10:30 AM Conan Bowensavis, Kelly M, MD [facility] None    Conan BowensKelly M Davis, MD

## 2018-05-13 LAB — CERVICOVAGINAL ANCILLARY ONLY
Bacterial vaginitis: POSITIVE — AB
CANDIDA VAGINITIS: POSITIVE — AB
Chlamydia: NEGATIVE
NEISSERIA GONORRHEA: NEGATIVE
TRICH (WINDOWPATH): NEGATIVE

## 2018-05-16 MED ORDER — CLOTRIMAZOLE 1 % VA CREA: 1.0000 | TOPICAL_CREAM | Freq: Every day | VAGINAL | 0 refills | Status: DC

## 2018-05-16 MED ORDER — METRONIDAZOLE 500 MG PO TABS: 500.0000 mg | ORAL_TABLET | Freq: Two times a day (BID) | ORAL | 0 refills | Status: DC

## 2018-05-16 NOTE — Addendum Note (Signed)
Addended by: Leroy LibmanAVIS, KELLY on: 05/16/2018 01:56 PM   Modules accepted: Orders

## 2018-05-17 ENCOUNTER — Ambulatory Visit (INDEPENDENT_AMBULATORY_CARE_PROVIDER_SITE_OTHER): Payer: BLUE CROSS/BLUE SHIELD | Admitting: Obstetrics and Gynecology

## 2018-05-17 ENCOUNTER — Telehealth (HOSPITAL_COMMUNITY): Payer: Self-pay | Admitting: *Deleted

## 2018-05-17 ENCOUNTER — Encounter: Payer: Self-pay | Admitting: Obstetrics and Gynecology

## 2018-05-17 ENCOUNTER — Ambulatory Visit (HOSPITAL_COMMUNITY)
Admission: RE | Admit: 2018-05-17 | Discharge: 2018-05-17 | Disposition: A | Discharge status: Home / Self Care | Payer: BLUE CROSS/BLUE SHIELD | Source: Ambulatory Visit | Attending: Obstetrics and Gynecology | Admitting: Obstetrics and Gynecology

## 2018-05-17 ENCOUNTER — Other Ambulatory Visit (HOSPITAL_COMMUNITY): Payer: Self-pay | Admitting: *Deleted

## 2018-05-17 ENCOUNTER — Encounter (HOSPITAL_COMMUNITY): Payer: Self-pay

## 2018-05-17 VITALS — BP 121/78 | HR 87 | Wt 234.0 lb

## 2018-05-17 DIAGNOSIS — K831 Obstruction of bile duct: Secondary | ICD-10-CM

## 2018-05-17 DIAGNOSIS — O26899 Other specified pregnancy related conditions, unspecified trimester: Secondary | ICD-10-CM

## 2018-05-17 DIAGNOSIS — Z362 Encounter for other antenatal screening follow-up: Secondary | ICD-10-CM | POA: Diagnosis not present

## 2018-05-17 DIAGNOSIS — O99210 Obesity complicating pregnancy, unspecified trimester: Secondary | ICD-10-CM

## 2018-05-17 DIAGNOSIS — Z3009 Encounter for other general counseling and advice on contraception: Secondary | ICD-10-CM

## 2018-05-17 DIAGNOSIS — E669 Obesity, unspecified: Secondary | ICD-10-CM | POA: Diagnosis not present

## 2018-05-17 DIAGNOSIS — Z3A35 35 weeks gestation of pregnancy: Secondary | ICD-10-CM | POA: Insufficient documentation

## 2018-05-17 DIAGNOSIS — O26642 Intrahepatic cholestasis of pregnancy, second trimester: Secondary | ICD-10-CM

## 2018-05-17 DIAGNOSIS — O26612 Liver and biliary tract disorders in pregnancy, second trimester: Secondary | ICD-10-CM

## 2018-05-17 DIAGNOSIS — Z6791 Unspecified blood type, Rh negative: Secondary | ICD-10-CM

## 2018-05-17 DIAGNOSIS — O26613 Liver and biliary tract disorders in pregnancy, third trimester: Principal | ICD-10-CM

## 2018-05-17 DIAGNOSIS — O0990 Supervision of high risk pregnancy, unspecified, unspecified trimester: Secondary | ICD-10-CM

## 2018-05-17 DIAGNOSIS — O99213 Obesity complicating pregnancy, third trimester: Secondary | ICD-10-CM | POA: Insufficient documentation

## 2018-05-17 NOTE — Progress Notes (Signed)
   PRENATAL VISIT NOTE  Subjective:  Rachel BurowOlga Campos is a 22 y.o. G2P1001 at 3760w1d being seen today for ongoing prenatal care.  She is currently monitored for the following issues for this high-risk pregnancy and has Supervision of high risk pregnancy, antepartum; Rh negative state in antepartum period; Obesity in pregnancy; Vitamin D deficiency; Cholestasis of pregnancy in second trimester; and Unwanted fertility on their problem list.  Patient reports occasional contractions.  Contractions: Irritability. Vag. Bleeding: None.  Movement: Present. Denies leaking of fluid.   The following portions of the patient's history were reviewed and updated as appropriate: allergies, current medications, past family history, past medical history, past social history, past surgical history and problem list. Problem list updated.  Objective:   Vitals:   05/17/18 1103  BP: 121/78  Pulse: 87  Weight: 234 lb (106.1 kg)    Fetal Status: Fetal Heart Rate (bpm): NST   Movement: Present     General:  Alert, oriented and cooperative. Patient is in no acute distress.  Skin: Skin is warm and dry. No rash noted.   Cardiovascular: Normal heart rate noted  Respiratory: Normal respiratory effort, no problems with respiration noted  Abdomen: Soft, gravid, appropriate for gestational age.  Pain/Pressure: Present     Pelvic: Cervical exam deferred        Extremities: Normal range of motion.  Edema: Trace  Mental Status: Normal mood and affect. Normal behavior. Normal judgment and thought content.   Assessment and Plan:  Pregnancy: G2P1001 at 5460w1d  1. Supervision of high risk pregnancy, antepartum IOL scheduled for 37 weeks Orders for admission placed  2. Rh negative state in antepartum period  3. Obesity in pregnancy  4. Cholestasis of pregnancy in second trimester Cont actigall Weekly NST/BPP BPP 8/8 today at MFM NST today reactive  5. Unwanted fertility For BTL   Preterm labor symptoms and general  obstetric precautions including but not limited to vaginal bleeding, contractions, leaking of fluid and fetal movement were reviewed in detail with the patient. Please refer to After Visit Summary for other counseling recommendations.  Return in about 1 week (around 05/24/2018) for OB visit, NST.  Future Appointments  Date Time Provider Department Center  05/24/2018 12:30 PM [facility] US 1 [facility]-US    Conan BowensKelly M Davis, MD

## 2018-05-17 NOTE — Progress Notes (Signed)
Pt is G2P1 71108w1d here for ROB/NST. Had BPP today.

## 2018-05-17 NOTE — Telephone Encounter (Signed)
Preadmission screen  

## 2018-05-24 ENCOUNTER — Ambulatory Visit (HOSPITAL_COMMUNITY)
Admission: RE | Admit: 2018-05-24 | Discharge: 2018-05-24 | Disposition: A | Discharge status: Home / Self Care | Payer: BLUE CROSS/BLUE SHIELD | Source: Ambulatory Visit | Attending: Obstetrics and Gynecology | Admitting: Obstetrics and Gynecology

## 2018-05-24 ENCOUNTER — Encounter (HOSPITAL_COMMUNITY): Payer: Self-pay

## 2018-05-24 DIAGNOSIS — Z3A36 36 weeks gestation of pregnancy: Secondary | ICD-10-CM | POA: Insufficient documentation

## 2018-05-24 DIAGNOSIS — O26613 Liver and biliary tract disorders in pregnancy, third trimester: Secondary | ICD-10-CM | POA: Diagnosis not present

## 2018-05-24 DIAGNOSIS — O99213 Obesity complicating pregnancy, third trimester: Secondary | ICD-10-CM | POA: Diagnosis not present

## 2018-05-24 DIAGNOSIS — O26643 Intrahepatic cholestasis of pregnancy, third trimester: Secondary | ICD-10-CM

## 2018-05-24 DIAGNOSIS — K831 Obstruction of bile duct: Secondary | ICD-10-CM | POA: Diagnosis not present

## 2018-05-26 ENCOUNTER — Ambulatory Visit (INDEPENDENT_AMBULATORY_CARE_PROVIDER_SITE_OTHER): Payer: BLUE CROSS/BLUE SHIELD | Admitting: Obstetrics

## 2018-05-26 ENCOUNTER — Encounter: Payer: Self-pay | Admitting: Obstetrics

## 2018-05-26 ENCOUNTER — Other Ambulatory Visit (HOSPITAL_COMMUNITY)
Admission: RE | Admit: 2018-05-26 | Discharge: 2018-05-26 | Disposition: A | Discharge status: Home / Self Care | Payer: BLUE CROSS/BLUE SHIELD | Source: Ambulatory Visit | Attending: Obstetrics | Admitting: Obstetrics

## 2018-05-26 VITALS — BP 131/78 | HR 96 | Wt 233.2 lb

## 2018-05-26 DIAGNOSIS — K831 Obstruction of bile duct: Secondary | ICD-10-CM

## 2018-05-26 DIAGNOSIS — O26893 Other specified pregnancy related conditions, third trimester: Secondary | ICD-10-CM

## 2018-05-26 DIAGNOSIS — O26899 Other specified pregnancy related conditions, unspecified trimester: Secondary | ICD-10-CM

## 2018-05-26 DIAGNOSIS — O26612 Liver and biliary tract disorders in pregnancy, second trimester: Secondary | ICD-10-CM

## 2018-05-26 DIAGNOSIS — O0990 Supervision of high risk pregnancy, unspecified, unspecified trimester: Secondary | ICD-10-CM | POA: Diagnosis present

## 2018-05-26 DIAGNOSIS — O0993 Supervision of high risk pregnancy, unspecified, third trimester: Secondary | ICD-10-CM | POA: Insufficient documentation

## 2018-05-26 DIAGNOSIS — Z6791 Unspecified blood type, Rh negative: Secondary | ICD-10-CM

## 2018-05-26 DIAGNOSIS — Z3A36 36 weeks gestation of pregnancy: Secondary | ICD-10-CM | POA: Diagnosis not present

## 2018-05-26 DIAGNOSIS — O26613 Liver and biliary tract disorders in pregnancy, third trimester: Secondary | ICD-10-CM

## 2018-05-26 NOTE — Progress Notes (Signed)
I have reviewed the chart and agree with nursing staff's documentation of this patient's encounter.  Coral Ceo, MD 05/26/2018 12:49 PM    Subjective:  Rachel Campos is a 22 y.o. G2P1001 at [redacted]w[redacted]d being seen today for ongoing prenatal care.  She is currently monitored for the following issues for this high-risk pregnancy and has Supervision of high risk pregnancy, antepartum; Rh negative state in antepartum period; Obesity in pregnancy; Vitamin D deficiency; Cholestasis of pregnancy in second trimester; and Unwanted fertility on their problem list.  Patient reports no complaints.  Contractions: Irregular. Vag. Bleeding: None.  Movement: Present. Denies leaking of fluid.   The following portions of the patient's history were reviewed and updated as appropriate: allergies, current medications, past family history, past medical history, past social history, past surgical history and problem list. Problem list updated.  Objective:   Vitals:   05/26/18 1121  BP: 131/78  Pulse: 96  Weight: 233 lb 3.2 oz (105.8 kg)    Fetal Status: Fetal Heart Rate (bpm): NST-R   Movement: Present     General:  Alert, oriented and cooperative. Patient is in no acute distress.  Skin: Skin is warm and dry. No rash noted.   Cardiovascular: Normal heart rate noted  Respiratory: Normal respiratory effort, no problems with respiration noted  Abdomen: Soft, gravid, appropriate for gestational age. Pain/Pressure: Present     Pelvic:  Cervical exam deferred        Extremities: Normal range of motion.  Edema: Trace  Mental Status: Normal mood and affect. Normal behavior. Normal judgment and thought content.   Urinalysis:      Assessment and Plan:  Pregnancy: G2P1001 at [redacted]w[redacted]d  1. Supervision of high risk pregnancy, antepartum Rx: - Strep Gp B NAA - GC/Chlamydia probe amp ([place])not at [facility]  2. Cholestasis of pregnancy in second trimester Rx: - Fetal nonstress test:  Reactive.  Good variability.  15x15  accels.  No decels.  3. Rh negative state in antepartum period - Rhogam postpartum  Term labor symptoms and general obstetric precautions including but not limited to vaginal bleeding, contractions, leaking of fluid and fetal movement were reviewed in detail with the patient. Please refer to After Visit Summary for other counseling recommendations.  Return in about 6 weeks (around 07/07/2018), or Postpartum.   Brock Bad, MD

## 2018-05-27 LAB — GC/CHLAMYDIA PROBE AMP (~~LOC~~) NOT AT ARMC
CHLAMYDIA, DNA PROBE: NEGATIVE
Neisseria Gonorrhea: NEGATIVE

## 2018-05-29 ENCOUNTER — Inpatient Hospital Stay (HOSPITAL_BASED_OUTPATIENT_CLINIC_OR_DEPARTMENT_OTHER)
Admission: EM | Admit: 2018-05-29 | Discharge: 2018-06-01 | DRG: 796 | Disposition: A | Discharge status: Home / Self Care | Payer: BLUE CROSS/BLUE SHIELD | Attending: Obstetrics & Gynecology | Admitting: Obstetrics & Gynecology

## 2018-05-29 ENCOUNTER — Other Ambulatory Visit: Payer: Self-pay

## 2018-05-29 ENCOUNTER — Encounter (HOSPITAL_BASED_OUTPATIENT_CLINIC_OR_DEPARTMENT_OTHER): Payer: Self-pay | Admitting: Emergency Medicine

## 2018-05-29 ENCOUNTER — Other Ambulatory Visit: Payer: Self-pay | Admitting: Obstetrics and Gynecology

## 2018-05-29 DIAGNOSIS — Z880 Allergy status to penicillin: Secondary | ICD-10-CM | POA: Diagnosis not present

## 2018-05-29 DIAGNOSIS — K831 Obstruction of bile duct: Secondary | ICD-10-CM | POA: Diagnosis present

## 2018-05-29 DIAGNOSIS — O99824 Streptococcus B carrier state complicating childbirth: Secondary | ICD-10-CM | POA: Diagnosis present

## 2018-05-29 DIAGNOSIS — E669 Obesity, unspecified: Secondary | ICD-10-CM | POA: Diagnosis present

## 2018-05-29 DIAGNOSIS — O99214 Obesity complicating childbirth: Secondary | ICD-10-CM | POA: Diagnosis present

## 2018-05-29 DIAGNOSIS — R109 Unspecified abdominal pain: Secondary | ICD-10-CM

## 2018-05-29 DIAGNOSIS — O26649 Intrahepatic cholestasis of pregnancy, unspecified trimester: Secondary | ICD-10-CM | POA: Diagnosis present

## 2018-05-29 DIAGNOSIS — Z302 Encounter for sterilization: Secondary | ICD-10-CM

## 2018-05-29 DIAGNOSIS — O2662 Liver and biliary tract disorders in childbirth: Secondary | ICD-10-CM | POA: Diagnosis present

## 2018-05-29 DIAGNOSIS — Z3A37 37 weeks gestation of pregnancy: Secondary | ICD-10-CM

## 2018-05-29 DIAGNOSIS — O26893 Other specified pregnancy related conditions, third trimester: Secondary | ICD-10-CM

## 2018-05-29 DIAGNOSIS — O26619 Liver and biliary tract disorders in pregnancy, unspecified trimester: Secondary | ICD-10-CM

## 2018-05-29 DIAGNOSIS — Z87891 Personal history of nicotine dependence: Secondary | ICD-10-CM

## 2018-05-29 LAB — COMPREHENSIVE METABOLIC PANEL
ALK PHOS: 95 U/L (ref 38–126)
ALT: 15 U/L (ref 0–44)
ANION GAP: 8 (ref 5–15)
AST: 17 U/L (ref 15–41)
Albumin: 3.2 g/dL — ABNORMAL LOW (ref 3.5–5.0)
BUN: 8 mg/dL (ref 6–20)
CALCIUM: 8.8 mg/dL — AB (ref 8.9–10.3)
CO2: 21 mmol/L — ABNORMAL LOW (ref 22–32)
Chloride: 107 mmol/L (ref 98–111)
Creatinine, Ser: 0.35 mg/dL — ABNORMAL LOW (ref 0.44–1.00)
GFR calc Af Amer: >60 mL/min (ref >60)
GFR calc non Af Amer: >60 mL/min (ref >60)
GLUCOSE: 82 mg/dL (ref 70–99)
Potassium: 4 mmol/L (ref 3.5–5.1)
Sodium: 136 mmol/L (ref 135–145)
Total Bilirubin: 0.3 mg/dL (ref 0.3–1.2)
Total Protein: 6.9 g/dL (ref 6.5–8.1)

## 2018-05-29 LAB — CBC WITH DIFFERENTIAL/PLATELET
Basophils Absolute: 0 10*3/uL (ref 0.0–0.1)
Basophils Relative: 0 %
EOS ABS: 0.2 10*3/uL (ref 0.0–0.7)
Eosinophils Relative: 1 %
HCT: 35.3 % — ABNORMAL LOW (ref 36.0–46.0)
HEMOGLOBIN: 12 g/dL (ref 12.0–15.0)
LYMPHS PCT: 12 %
Lymphs Abs: 1.9 10*3/uL (ref 0.7–4.0)
MCH: 30.2 pg (ref 26.0–34.0)
MCHC: 34 g/dL (ref 30.0–36.0)
MCV: 88.9 fL (ref 78.0–100.0)
Monocytes Absolute: 1.4 10*3/uL — ABNORMAL HIGH (ref 0.1–1.0)
Monocytes Relative: 9 %
NEUTROS PCT: 78 %
Neutro Abs: 12.2 10*3/uL — ABNORMAL HIGH (ref 1.7–7.7)
Platelets: 246 10*3/uL (ref 150–400)
RBC: 3.97 MIL/uL (ref 3.87–5.11)
RDW: 13.3 % (ref 11.5–15.5)
WBC: 15.7 10*3/uL — ABNORMAL HIGH (ref 4.0–10.5)

## 2018-05-29 LAB — STREP GP B NAA: Strep Gp B NAA: POSITIVE — AB

## 2018-05-29 MED ORDER — LACTATED RINGERS IV BOLUS
1000.0000 mL | Freq: Once | INTRAVENOUS | Status: AC
  Administered 2018-05-29: 1000 mL via INTRAVENOUS

## 2018-05-29 MED ORDER — OXYCODONE-ACETAMINOPHEN 5-325 MG PO TABS: 2.0000 | ORAL_TABLET | ORAL | Status: DC | PRN

## 2018-05-29 MED ORDER — LACTATED RINGERS IV SOLN
INTRAVENOUS | Status: DC
  Administered 2018-05-29 – 2018-05-30 (×3): via INTRAVENOUS

## 2018-05-29 MED ORDER — TERBUTALINE SULFATE 1 MG/ML IJ SOLN: 0.2500 mg | Freq: Once | INTRAMUSCULAR | Status: DC | PRN

## 2018-05-29 MED ORDER — SOD CITRATE-CITRIC ACID 500-334 MG/5ML PO SOLN: 30.0000 mL | ORAL | Status: DC | PRN

## 2018-05-29 MED ORDER — OXYCODONE-ACETAMINOPHEN 5-325 MG PO TABS: 1.0000 | ORAL_TABLET | ORAL | Status: DC | PRN

## 2018-05-29 MED ORDER — OXYTOCIN 40 UNITS IN LACTATED RINGERS INFUSION - SIMPLE MED
2.5000 [IU]/h | INTRAVENOUS | Status: DC
  Filled 2018-05-29: qty 1000

## 2018-05-29 MED ORDER — MISOPROSTOL 25 MCG QUARTER TABLET
25.0000 ug | ORAL_TABLET | ORAL | Status: DC | PRN
  Filled 2018-05-29: qty 1

## 2018-05-29 MED ORDER — ACETAMINOPHEN 325 MG PO TABS: 650.0000 mg | ORAL_TABLET | ORAL | Status: DC | PRN

## 2018-05-29 MED ORDER — LACTATED RINGERS IV SOLN
500.0000 mL | INTRAVENOUS | Status: DC | PRN
  Administered 2018-05-30: 300 mL via INTRAVENOUS

## 2018-05-29 MED ORDER — ONDANSETRON HCL 4 MG/2ML IJ SOLN
4.0000 mg | Freq: Four times a day (QID) | INTRAMUSCULAR | Status: DC | PRN
  Administered 2018-05-30: 4 mg via INTRAVENOUS
  Filled 2018-05-29: qty 2

## 2018-05-29 MED ORDER — OXYTOCIN BOLUS FROM INFUSION
500.0000 mL | Freq: Once | INTRAVENOUS | Status: AC
  Administered 2018-05-30: 500 mL via INTRAVENOUS

## 2018-05-29 MED ORDER — LIDOCAINE HCL (PF) 1 % IJ SOLN
30.0000 mL | INTRAMUSCULAR | Status: DC | PRN
  Filled 2018-05-29: qty 30

## 2018-05-29 NOTE — ED Notes (Signed)
Pt assisted to bedside commode with no difficulty.

## 2018-05-29 NOTE — ED Notes (Signed)
Notified by RR RN that plan is to admit for induction. Per Wilkie Aye RR RN she will report to L&D RN.

## 2018-05-29 NOTE — Progress Notes (Signed)
Dr Vergie Living called and notified about patient who presents to [facility] orders given to transfer patient to [facility] to start induction process;

## 2018-05-29 NOTE — H&P (Signed)
Rachel Campos is a 22 y.o. female presenting for IOL for Cholestasis of pregnancy. Patient previously had IOL scheduled for 05/31/18 but today sustained a fall at playground in which she sustained direct abdominal trauma. Dating is based on LMP. Patient received her care at [facility].  Pregnancy is complicated by high risk pregnancy, antepartum; Rh negative state in antepartum period; Obesity in pregnancy; Vitamin D deficiency; Cholestasis of pregnancy in second trimester; Unwanted fertility; and Cholestasis of pregnancy on their problem list.   OB History    Gravida  2   Para  1   Term  1   Preterm      AB      Living  1     SAB      TAB      Ectopic      Multiple  0   Live Births  1          Past Medical History:  Diagnosis Date  . Anxiety   . Depression   . Headache   . Thoracic spine pain 06/26/2016   Past Surgical History:  Procedure Laterality Date  . NO PAST SURGERIES     Family History: She was adopted. Family history is unknown by patient. Social History:  reports that she has quit smoking. She has never used smokeless tobacco. She reports that she drinks alcohol. She reports that she does not use drugs.   Nursing Staff Provider  Office Location  [facility] Dating   LMP  Language   Engl Anatomy US    Flu Vaccine   declined Genetic Screen  NIPS: low risk  AFP:   First Screen:  Quad:    TDaP vaccine    Hgb A1C or  GTT Normal 80/126/95  Rhogam     LAB RESULTS   Feeding Plan  Blood Type B/Negative/-- (03/04 1353)   Contraception  Antibody Negative (03/04 1353)  Circumcision  Rubella 13.00 (03/04 1353)  Pediatrician   RPR Non Reactive (06/24 1029)   Support Person  HBsAg Negative (03/04 1353)   Prenatal Classes  HIV Non Reactive (06/24 1029)  BTL Consent  04/11/18 GBS Positive (09/05 1330)(For PCN allergy, check sensitivities)   VBAC Consent  Pap     Hgb Electro      CF     SMA     Waterbirth  [ ]  Class [ ]  Consent [ ]  CNM visit       Maternal  Diabetes: No Genetic Screening: Normal Maternal Ultrasounds/Referrals: Normal Fetal Ultrasounds or other Referrals:  None Maternal Substance Abuse:  No Significant Maternal Medications: Actigall Significant Maternal Lab Results:  None Other Comments:  None  Review of Systems  Respiratory: Negative for shortness of breath.   Cardiovascular: Negative for chest pain.  Gastrointestinal: Negative for abdominal pain.  Musculoskeletal: Negative for back pain.  Neurological: Negative for dizziness and headaches.  Psychiatric/Behavioral: Negative for depression, substance abuse and suicidal ideas. The patient is not nervous/anxious and does not have insomnia.   All other systems reviewed and are negative.  History   Blood pressure 134/82, pulse 83, temperature 98.8 F (37.1 C), temperature source Oral, resp. rate (!) 22, height 5\' 5"  (1.651 m), weight 105.7 kg, last menstrual period 09/13/2017, SpO2 99 %, currently breastfeeding. Exam Physical Exam  Nursing note and vitals reviewed. Constitutional: She is oriented to person, place, and time. She appears well-developed and well-nourished.  Cardiovascular: Normal rate, normal heart sounds and intact distal pulses.  Respiratory: Effort normal.  GI:  Gravid  Musculoskeletal: Normal range of motion.  Neurological: She is alert and oriented to person, place, and time. She has normal reflexes.  Skin: Skin is warm and dry.  Psychiatric: She has a normal mood and affect. Her behavior is normal. Judgment and thought content normal.    Reactive EFM/Catgeory I: baseline 145, moderate variability, positive accelerations, no decelerations Toco: occasional contractions, not felt by patient, uterine irritability  Prenatal labs: ABO, Rh: B/Negative/-- (03/04 1353) Antibody: Negative (03/04 1353) Rubella: 13.00 (03/04 1353) RPR: Non Reactive (06/24 1029)  HBsAg: Negative (03/04 1353)  HIV: Non Reactive (06/24 1029)  GBS: Positive (09/05 1330)    Assessment/Plan: --IOL for Cholestasis of pregnancy, today sustained fall with direct abdominal trauma --SVE, initiate IOL after midnight at 37+0 GA --Category I fetal tracing --GBS POS: PCN --Blood Type B NEG: given Rhogam at [redacted] weeks GA, plan pp Rhogam --Planning Epidural --boy/both/outpatient circ --BTL papers signed 04/11/18  Calvert Cantor, CNM 05/29/2018, 11:03 PM

## 2018-05-29 NOTE — Progress Notes (Signed)
RROB notified of patient who presents to [facility] with complaints of fall today directly on abdomen; patient is a G2P1 who is 21 and 6/[redacted] weeks along in her pregnancy at this time; patient states she is having some sharp intermittent abdominal pain but denies bleeding or LOF; patient is set to be an induction on Tuesday for ICP; EFM applied and assessing at this time

## 2018-05-29 NOTE — ED Triage Notes (Addendum)
Fall today while at the park onto her abd. States she is [redacted] wks pregnant tomorrow, and is scheduled for induction on Tuesday due to ICP. EDP at bedside.Pt denies contractions, bleeding or loss of fluid. Pt is G2P1.

## 2018-05-29 NOTE — ED Provider Notes (Addendum)
[facility] EMERGENCY DEPARTMENT Provider Note   CSN: [id number] Arrival date & time: 05/29/18  1909     History   Chief Complaint Chief Complaint  Patient presents with  . Fall, [redacted] wks pregnant    HPI Rachel Campos is a 22 y.o. female who is [redacted] weeks pregnant here presenting with fall.  She states that she was at the park and twisted her ankle and fell directly on her abdomen.  She has some abdominal cramps but denies any leakage of fluids.  Patient came straight to the ED.  She did not have any head injury or vomiting.  She states that she has some complications during her pregnancy and is scheduled for induction in 2 days at [facility] hospital.   The history is provided by the patient.    Past Medical History:  Diagnosis Date  . Anxiety   . Depression   . Headache   . Thoracic spine pain 06/26/2016    Patient Active Problem List   Diagnosis Date Noted  . Unwanted fertility 04/11/2018  . Cholestasis of pregnancy in second trimester 02/02/2018  . Vitamin D deficiency 11/24/2017  . Supervision of high risk pregnancy, antepartum 11/22/2017  . Rh negative state in antepartum period 11/22/2017  . Obesity in pregnancy 11/22/2017    Past Surgical History:  Procedure Laterality Date  . NO PAST SURGERIES       OB History    Gravida  2   Para  1   Term  1   Preterm      AB      Living  1     SAB      TAB      Ectopic      Multiple  0   Live Births  1            Home Medications    Prior to Admission medications   Medication Sig Start Date End Date Taking? Authorizing Provider  clotrimazole (GYNE-LOTRIMIN) 1 % vaginal cream Place 1 Applicatorful vaginally at bedtime. 04/11/18   Hermina Staggers, MD  clotrimazole (GYNE-LOTRIMIN) 1 % vaginal cream Place 1 Applicatorful vaginally at bedtime. 05/16/18   Conan Bowens, MD  Prenatal-Fe Fum-Methf-FA w/o A (VITAFOL-NANO) 18-0.6-0.4 MG TABS Take 1 tablet by mouth daily. 11/22/17   Denney, Rachelle A, CNM   ursodiol (ACTIGALL) 250 MG tablet Take 1 tablet (250 mg total) by mouth 3 (three) times daily. 04/11/18   Hermina Staggers, MD  Vitamin D, Ergocalciferol, (DRISDOL) 50000 units CAPS capsule Take 1 capsule (50,000 Units total) by mouth every 7 (seven) days. 11/24/17   Roe Coombs, CNM    Family History Family History  Adopted: Yes  Family history unknown: Yes    Social History Social History   Tobacco Use  . Smoking status: Former Games developer  . Smokeless tobacco: Never Used  Substance Use Topics  . Alcohol use: Yes  . Drug use: No     Allergies   Patient has no known allergies.   Review of Systems Review of Systems  Gastrointestinal: Positive for abdominal pain.  All other systems reviewed and are negative.    Physical Exam Updated Vital Signs Ht 5\' 5"  (1.651 m)   Wt 105.7 kg   LMP 09/13/2017   BMI 38.77 kg/m   Physical Exam  Constitutional: She is oriented to person, place, and time.  Uncomfortable   HENT:  Head: Normocephalic.  Eyes: Pupils are equal, round, and reactive to light.  Conjunctivae and EOM are normal.  Neck: Normal range of motion. Neck supple.  Cardiovascular: Normal rate, regular rhythm and normal heart sounds.  Pulmonary/Chest: Effort normal and breath sounds normal.  Abdominal:  Gravid uterus   Musculoskeletal: Normal range of motion.  Neurological: She is alert and oriented to person, place, and time.  Skin: Skin is warm.  Psychiatric: She has a normal mood and affect.  Nursing note and vitals reviewed.    ED Treatments / Results  Labs (all labs ordered are listed, but only abnormal results are displayed) Labs Reviewed  CBC WITH DIFFERENTIAL/PLATELET  COMPREHENSIVE METABOLIC PANEL    EKG None  Radiology No results found.  Procedures Procedures (including critical care time)  CRITICAL CARE Performed by: Richardean Canal   Total critical care time: 30 minutes  Critical care time was exclusive of separately billable  procedures and treating other patients.  Critical care was necessary to treat or prevent imminent or life-threatening deterioration.  Critical care was time spent personally by me on the following activities: development of treatment plan with patient and/or surrogate as well as nursing, discussions with consultants, evaluation of patient's response to treatment, examination of patient, obtaining history from patient or surrogate, ordering and performing treatments and interventions, ordering and review of laboratory studies, ordering and review of radiographic studies, pulse oximetry and re-evaluation of patient's condition.   Medications Ordered in ED Medications  lactated ringers bolus 1,000 mL (has no administration in time range)     Initial Impression / Assessment and Plan / ED Course  I have reviewed the triage vital signs and the nursing notes.  Pertinent labs & imaging results that were available during my care of the patient were reviewed by me and considered in my medical decision making (see chart for details).    Rachel Campos is a 22 y.o. female here with fall, abdominal pain. Patient [redacted] weeks pregnant, high risk pregnancy, scheduled for induction in 2 days. Will place on toco and baby monitor.   9:18 PM Rapid OB recommend transfer to [facility] hospital under Dr. Vergie Living. Stable for transfer.    Final Clinical Impressions(s) / ED Diagnoses   Final diagnoses:  None    ED Discharge Orders    None       Charlynne Pander, MD 05/29/18 2119    Charlynne Pander, MD 05/29/18 2119

## 2018-05-29 NOTE — ED Notes (Signed)
Pt on monitor with FHT reading 179 bpm. OB RR RN contacted, aware of patient. Awaiting advice from OB.

## 2018-05-30 ENCOUNTER — Inpatient Hospital Stay (HOSPITAL_COMMUNITY): Payer: BLUE CROSS/BLUE SHIELD | Admitting: Anesthesiology

## 2018-05-30 ENCOUNTER — Encounter (HOSPITAL_COMMUNITY): Payer: Self-pay

## 2018-05-30 ENCOUNTER — Other Ambulatory Visit: Payer: Self-pay | Admitting: Obstetrics

## 2018-05-30 DIAGNOSIS — Z3A37 37 weeks gestation of pregnancy: Secondary | ICD-10-CM

## 2018-05-30 DIAGNOSIS — O2662 Liver and biliary tract disorders in childbirth: Secondary | ICD-10-CM

## 2018-05-30 DIAGNOSIS — K831 Obstruction of bile duct: Secondary | ICD-10-CM

## 2018-05-30 LAB — CBC
HCT: 34.3 % — ABNORMAL LOW (ref 36.0–46.0)
HEMOGLOBIN: 11.6 g/dL — AB (ref 12.0–15.0)
MCH: 30.4 pg (ref 26.0–34.0)
MCHC: 33.8 g/dL (ref 30.0–36.0)
MCV: 89.8 fL (ref 78.0–100.0)
Platelets: 200 10*3/uL (ref 150–400)
RBC: 3.82 MIL/uL — ABNORMAL LOW (ref 3.87–5.11)
RDW: 13.4 % (ref 11.5–15.5)
WBC: 13.1 10*3/uL — ABNORMAL HIGH (ref 4.0–10.5)

## 2018-05-30 LAB — COMPREHENSIVE METABOLIC PANEL WITH GFR
ALT: 15 U/L (ref 0–44)
AST: 14 U/L — ABNORMAL LOW (ref 15–41)
Albumin: 2.8 g/dL — ABNORMAL LOW (ref 3.5–5.0)
Alkaline Phosphatase: 86 U/L (ref 38–126)
Anion gap: 10 (ref 5–15)
BUN: 6 mg/dL (ref 6–20)
CO2: 20 mmol/L — ABNORMAL LOW (ref 22–32)
Calcium: 8.5 mg/dL — ABNORMAL LOW (ref 8.9–10.3)
Chloride: 104 mmol/L (ref 98–111)
Creatinine, Ser: 0.39 mg/dL — ABNORMAL LOW (ref 0.44–1.00)
GFR calc Af Amer: >60 mL/min
GFR calc non Af Amer: >60 mL/min
Glucose, Bld: 98 mg/dL (ref 70–99)
Potassium: 3.9 mmol/L (ref 3.5–5.1)
Sodium: 134 mmol/L — ABNORMAL LOW (ref 135–145)
Total Bilirubin: 0.6 mg/dL (ref 0.3–1.2)
Total Protein: 5.6 g/dL — ABNORMAL LOW (ref 6.5–8.1)

## 2018-05-30 LAB — KLEIHAUER-BETKE STAIN
# Vials RhIg: 1
FETAL CELLS %: 0 %
QUANTITATION FETAL HEMOGLOBIN: 0 mL

## 2018-05-30 LAB — RPR: RPR: NONREACTIVE

## 2018-05-30 MED ORDER — DIPHENHYDRAMINE HCL 50 MG/ML IJ SOLN: 12.5000 mg | INTRAMUSCULAR | Status: DC | PRN

## 2018-05-30 MED ORDER — OXYTOCIN 40 UNITS IN LACTATED RINGERS INFUSION - SIMPLE MED
1.0000 m[IU]/min | INTRAVENOUS | Status: DC
  Administered 2018-05-30: 6 m[IU]/min via INTRAVENOUS
  Administered 2018-05-30: 4 m[IU]/min via INTRAVENOUS
  Administered 2018-05-30: 12 m[IU]/min via INTRAVENOUS
  Administered 2018-05-30: 10 m[IU]/min via INTRAVENOUS
  Administered 2018-05-30: 2 m[IU]/min via INTRAVENOUS

## 2018-05-30 MED ORDER — DIPHENHYDRAMINE HCL 25 MG PO CAPS: 25.0000 mg | ORAL_CAPSULE | Freq: Four times a day (QID) | ORAL | Status: DC | PRN

## 2018-05-30 MED ORDER — PENICILLIN G POTASSIUM 5000000 UNITS IJ SOLR: 5.0000 10*6.[IU] | Freq: Once | INTRAMUSCULAR | Status: DC

## 2018-05-30 MED ORDER — FENTANYL CITRATE (PF) 100 MCG/2ML IJ SOLN
50.0000 ug | INTRAMUSCULAR | Status: DC | PRN
  Administered 2018-05-30: 50 ug via INTRAVENOUS
  Filled 2018-05-30: qty 2

## 2018-05-30 MED ORDER — EPHEDRINE 5 MG/ML INJ
10.0000 mg | INTRAVENOUS | Status: DC | PRN
  Filled 2018-05-30: qty 2

## 2018-05-30 MED ORDER — IBUPROFEN 600 MG PO TABS
600.0000 mg | ORAL_TABLET | Freq: Four times a day (QID) | ORAL | Status: DC
  Administered 2018-05-31 – 2018-06-01 (×5): 600 mg via ORAL
  Filled 2018-05-30 (×5): qty 1

## 2018-05-30 MED ORDER — COCONUT OIL OIL: 1.0000 "application " | TOPICAL_OIL | Status: DC | PRN

## 2018-05-30 MED ORDER — FENTANYL 2.5 MCG/ML BUPIVACAINE 1/10 % EPIDURAL INFUSION (WH - ANES)
14.0000 mL/h | INTRAMUSCULAR | Status: DC | PRN
  Administered 2018-05-30 (×2): 14 mL/h via EPIDURAL
  Filled 2018-05-30 (×2): qty 100

## 2018-05-30 MED ORDER — FENTANYL CITRATE (PF) 100 MCG/2ML IJ SOLN
100.0000 ug | INTRAMUSCULAR | Status: DC | PRN
  Administered 2018-05-30: 100 ug via INTRAVENOUS

## 2018-05-30 MED ORDER — TERBUTALINE SULFATE 1 MG/ML IJ SOLN
0.2500 mg | Freq: Once | INTRAMUSCULAR | Status: DC | PRN
  Filled 2018-05-30: qty 1

## 2018-05-30 MED ORDER — ZOLPIDEM TARTRATE 5 MG PO TABS: 5.0000 mg | ORAL_TABLET | Freq: Every evening | ORAL | Status: DC | PRN

## 2018-05-30 MED ORDER — ONDANSETRON HCL 4 MG/2ML IJ SOLN: 4.0000 mg | INTRAMUSCULAR | Status: DC | PRN

## 2018-05-30 MED ORDER — FENTANYL CITRATE (PF) 100 MCG/2ML IJ SOLN
INTRAMUSCULAR | Status: AC
  Filled 2018-05-30: qty 2

## 2018-05-30 MED ORDER — PENICILLIN G 3 MILLION UNITS IVPB - SIMPLE MED
3.0000 10*6.[IU] | INTRAVENOUS | Status: DC
  Administered 2018-05-30 (×3): 3 10*6.[IU] via INTRAVENOUS
  Filled 2018-05-30 (×2): qty 3
  Filled 2018-05-30: qty 100
  Filled 2018-05-30: qty 3
  Filled 2018-05-30 (×2): qty 100

## 2018-05-30 MED ORDER — ACETAMINOPHEN 325 MG PO TABS
650.0000 mg | ORAL_TABLET | ORAL | Status: DC | PRN
  Administered 2018-05-30 – 2018-05-31 (×2): 650 mg via ORAL
  Filled 2018-05-30 (×2): qty 2

## 2018-05-30 MED ORDER — DIBUCAINE 1 % RE OINT: 1.0000 "application " | TOPICAL_OINTMENT | RECTAL | Status: DC | PRN

## 2018-05-30 MED ORDER — BENZOCAINE-MENTHOL 20-0.5 % EX AERO: 1.0000 "application " | INHALATION_SPRAY | CUTANEOUS | Status: DC | PRN

## 2018-05-30 MED ORDER — PRENATAL MULTIVITAMIN CH: 1.0000 | ORAL_TABLET | Freq: Every day | ORAL | Status: DC

## 2018-05-30 MED ORDER — LIDOCAINE HCL (PF) 1 % IJ SOLN
INTRAMUSCULAR | Status: DC | PRN
  Administered 2018-05-30 (×2): 5 mL via EPIDURAL

## 2018-05-30 MED ORDER — PHENYLEPHRINE 40 MCG/ML (10ML) SYRINGE FOR IV PUSH (FOR BLOOD PRESSURE SUPPORT)
80.0000 ug | PREFILLED_SYRINGE | INTRAVENOUS | Status: DC | PRN
  Filled 2018-05-30: qty 10
  Filled 2018-05-30: qty 5

## 2018-05-30 MED ORDER — MISOPROSTOL 50MCG HALF TABLET
50.0000 ug | ORAL_TABLET | ORAL | Status: DC | PRN
  Administered 2018-05-30 (×2): 50 ug via ORAL
  Filled 2018-05-30 (×3): qty 1

## 2018-05-30 MED ORDER — ONDANSETRON HCL 4 MG PO TABS: 4.0000 mg | ORAL_TABLET | ORAL | Status: DC | PRN

## 2018-05-30 MED ORDER — LACTATED RINGERS IV SOLN
500.0000 mL | Freq: Once | INTRAVENOUS | Status: AC
  Administered 2018-05-30: 500 mL via INTRAVENOUS

## 2018-05-30 MED ORDER — TETANUS-DIPHTH-ACELL PERTUSSIS 5-2.5-18.5 LF-MCG/0.5 IM SUSP
0.5000 mL | Freq: Once | INTRAMUSCULAR | Status: AC
  Administered 2018-06-01: 0.5 mL via INTRAMUSCULAR
  Filled 2018-05-30: qty 0.5

## 2018-05-30 MED ORDER — SENNOSIDES-DOCUSATE SODIUM 8.6-50 MG PO TABS
2.0000 | ORAL_TABLET | ORAL | Status: DC
  Administered 2018-05-31 (×2): 2 via ORAL
  Filled 2018-05-30 (×2): qty 2

## 2018-05-30 MED ORDER — PHENYLEPHRINE 40 MCG/ML (10ML) SYRINGE FOR IV PUSH (FOR BLOOD PRESSURE SUPPORT)
80.0000 ug | PREFILLED_SYRINGE | INTRAVENOUS | Status: DC | PRN
  Filled 2018-05-30: qty 5

## 2018-05-30 MED ORDER — SIMETHICONE 80 MG PO CHEW: 80.0000 mg | CHEWABLE_TABLET | ORAL | Status: DC | PRN

## 2018-05-30 MED ORDER — SODIUM CHLORIDE 0.9 % IV SOLN
5.0000 10*6.[IU] | Freq: Once | INTRAVENOUS | Status: AC
  Administered 2018-05-30: 5 10*6.[IU] via INTRAVENOUS
  Filled 2018-05-30: qty 5

## 2018-05-30 MED ORDER — WITCH HAZEL-GLYCERIN EX PADS: 1.0000 "application " | MEDICATED_PAD | CUTANEOUS | Status: DC | PRN

## 2018-05-30 NOTE — Anesthesia Procedure Notes (Addendum)
Epidural Patient location during procedure: OB Start time: 05/30/2018 12:07 PM End time: 05/30/2018 12:20 PM  Staffing Anesthesiologist: Phillips Grout, MD Performed: anesthesiologist   Preanesthetic Checklist Completed: patient identified, site marked, surgical consent, pre-op evaluation, timeout performed, IV checked, risks and benefits discussed and monitors and equipment checked  Epidural Patient position: sitting Prep: DuraPrep Patient monitoring: heart rate, continuous pulse ox and blood pressure Approach: right paramedian Location: L3-L4 Injection technique: LOR saline  Needle:  Needle type: Tuohy  Needle gauge: 17 G Needle length: 9 cm and 9 Needle insertion depth: 6 cm Catheter type: closed end flexible Catheter size: 20 Guage Catheter at skin depth: 10 cm Test dose: negative  Assessment Events: blood not aspirated, injection not painful, no injection resistance, negative IV test and no paresthesia  Additional Notes Patient identified. Risks/Benefits/Options discussed with patient including but not limited to bleeding, infection, nerve damage, paralysis, failed block, incomplete pain control, headache, blood pressure changes, nausea, vomiting, reactions to medication both or allergic, itching and postpartum back pain. Confirmed with bedside nurse the patient's most recent platelet count. Confirmed with patient that they are not currently taking any anticoagulation, have any bleeding history or any family history of bleeding disorders. Patient expressed understanding and wished to proceed. All questions were answered. Sterile technique was used throughout the entire procedure. Please see nursing notes for vital signs. Test dose was given through epidural needle and negative prior to continuing to dose epidural or start infusion. Warning signs of high block given to the patient including shortness of breath, tingling/numbness in hands, complete motor block, or any concerning  symptoms with instructions to call for help. Patient was given instructions on fall risk and not to get out of bed. All questions and concerns addressed with instructions to call with any issues.

## 2018-05-30 NOTE — Progress Notes (Signed)
Labor Progress Note Rachel Campos is a 22 y.o. G2P1001 at [redacted]w[redacted]d presented for IOL for cholestasis of pregnancy.  S:  Pt experiencing contractions with moderate discomfort.  O:  BP (!) 115/58   Pulse 81   Temp 98.5 F (36.9 C) (Oral)   Resp 18   Ht 5\' 5"  (1.651 m)   Wt 105.7 kg   LMP 09/13/2017   SpO2 99%   BMI 38.77 kg/m  EFM: 145/moderate var/pos accels/ no decels  CVE: Dilation: (P) 2 Effacement (%): (P) 60 Cervical Position: Posterior Station: (P) -3 Presentation: (P) Vertex Exam by:: (P) Williams, CNM   A&P: 22 y.o. G2P1001 [redacted]w[redacted]d here for IOL for cholestasis of pregnancy. #Labor: Progressing on PO cytotec x2, PB placed 10:00am #Pain: planning epidural #FWB: category I #GBS positive, PNC    Mirian Mo, MD 10:04 AM

## 2018-05-30 NOTE — Progress Notes (Signed)
Labor Progress Note Rachel Campos is a 22 y.o. G2P1001 at [redacted]w[redacted]d presented for IOL for cholestasis of pregnancy.  S:  Pt now experiencing painful contractions. Would like epidural.  O:  BP (!) 146/82   Pulse 85   Temp 98.4 F (36.9 C) (Oral)   Resp 20   Ht 5\' 5"  (1.651 m)   Wt 105.7 kg   LMP 09/13/2017   SpO2 99%   BMI 38.77 kg/m  EFM: 140/moderate var/pos accels/ one mod variable  CVE: Dilation: 5 Effacement (%): 80 Cervical Position: Middle Station: -2 Presentation: Vertex Exam by:: Takai Chiaramonte/sowder   A&P: 22 y.o. G2P1001 [redacted]w[redacted]d here for IOL for cholestasis. #Labor: Progressing well on PO cytotec x 2, FB fallen out, starting pit 2x2 #Pain: requesting epidural now #FWB: category I #GBS positive, PCN   Mirian Mo, MD 11:58 AM

## 2018-05-30 NOTE — Anesthesia Preprocedure Evaluation (Signed)

## 2018-05-30 NOTE — Anesthesia Pain Management Evaluation Note (Signed)
  CRNA Pain Management Visit Note  Patient: Rachel Campos, 22 y.o., female  "Hello I am a member of the anesthesia team at [facility]. We have an anesthesia team available at all times to provide care throughout the hospital, including epidural management and anesthesia for C-section. I don't know your plan for the delivery whether it a natural birth, water birth, IV sedation, nitrous supplementation, doula or epidural, but we want to meet your pain goals."   1.Was your pain managed to your expectations on prior hospitalizations?   Yes   2.What is your expectation for pain management during this hospitalization?     Epidural, IV pain meds and Nitrous Oxide  3.How can we help you reach that goal? Be available  Record the patient's initial score and the patient's pain goal.   Pain: 7  Pain Goal: 7 [facility] wants you to be able to say your pain was always managed very well.  [facility] 05/30/2018

## 2018-05-30 NOTE — Progress Notes (Signed)
Rachel Campos is a 22 y.o. G2P1001 at [redacted]w[redacted]d admitted for induction of labor due to Cholestasis of pregnancy.  Subjective:   Objective: BP 120/68   Pulse 79   Temp 98 F (36.7 C) (Oral)   Resp 16   Ht 5\' 5"  (1.651 m)   Wt 105.7 kg   LMP 09/13/2017   SpO2 99%   BMI 38.77 kg/m   No intake/output data recorded. Total I/O In: 1000 [IV Piggyback:1000] Out: -   FHT:  FHR: 135 bpm, variability: moderate,  accelerations:  Present,  decelerations:  Absent UC:   occasional SVE:   Dilation: Closed Effacement (%): Thick Station: -3 Exam by:: Sam CNM  Labs: Lab Results  Component Value Date   WBC 15.7 (H) 05/29/2018   HGB 12.0 05/29/2018   HCT 35.3 (L) 05/29/2018   MCV 88.9 05/29/2018   PLT 246 05/29/2018    Assessment / Plan: Closed cervix. Place Cytotec #2 at 0430  IOL, cervical ripening progressing normally Preeclampsia:  N/A Fetal Wellbeing:  Category I Pain Control:  planning epidural, currently denies pain I/D:  n/a Anticipated MOD:  NSVD  Calvert Cantor, CNM 05/30/2018, 4:12 AM

## 2018-05-30 NOTE — Progress Notes (Signed)
Patient ID: Rachel Campos, female   DOB: 01-19-1996, 22 y.o.   MRN: [id number] Fetal heart rate baseline elevated but variability and reactivity are good No fever  Vitals:   05/30/18 1331 05/30/18 1401 05/30/18 1431 05/30/18 1502  BP: 124/66 125/70 134/71 95/76  Pulse: 77 73 75 77  Resp:  20 20 20   Temp:    98.4 F (36.9 C)  TempSrc:    Oral  SpO2:      Weight:      Height:       AROM clear fluid IUPC inserted  Dilation: 6 Effacement (%): 80 Cervical Position: Middle Station: -2 Presentation: Vertex Exam by:: sowder

## 2018-05-31 ENCOUNTER — Encounter (HOSPITAL_COMMUNITY): Payer: Self-pay

## 2018-05-31 ENCOUNTER — Encounter (HOSPITAL_COMMUNITY): Payer: Self-pay | Admitting: Family Medicine

## 2018-05-31 ENCOUNTER — Encounter (HOSPITAL_COMMUNITY)
Admission: EM | Disposition: A | Discharge status: Home / Self Care | Payer: Self-pay | Source: Home / Self Care | Attending: Obstetrics & Gynecology

## 2018-05-31 ENCOUNTER — Inpatient Hospital Stay (HOSPITAL_COMMUNITY): Payer: BLUE CROSS/BLUE SHIELD | Admitting: Certified Registered Nurse Anesthetist

## 2018-05-31 ENCOUNTER — Inpatient Hospital Stay (HOSPITAL_COMMUNITY): Admission: RE | Admit: 2018-05-31 | Payer: BLUE CROSS/BLUE SHIELD | Source: Ambulatory Visit

## 2018-05-31 DIAGNOSIS — Z302 Encounter for sterilization: Secondary | ICD-10-CM

## 2018-05-31 HISTORY — PX: TUBAL LIGATION: SHX77

## 2018-05-31 LAB — TYPE AND SCREEN
ABO/RH(D): B NEG
ANTIBODY SCREEN: POSITIVE
Unit division: 0
Unit division: 0

## 2018-05-31 LAB — CBC
HCT: 33.2 % — ABNORMAL LOW (ref 36.0–46.0)
Hemoglobin: 11.2 g/dL — ABNORMAL LOW (ref 12.0–15.0)
MCH: 30.1 pg (ref 26.0–34.0)
MCHC: 33.7 g/dL (ref 30.0–36.0)
MCV: 89.2 fL (ref 78.0–100.0)
Platelets: 191 10*3/uL (ref 150–400)
RBC: 3.72 MIL/uL — ABNORMAL LOW (ref 3.87–5.11)
RDW: 13.2 % (ref 11.5–15.5)
WBC: 11.6 10*3/uL — ABNORMAL HIGH (ref 4.0–10.5)

## 2018-05-31 LAB — KLEIHAUER-BETKE STAIN
# Vials RhIg: 1
FETAL CELLS %: 0 %
Quantitation Fetal Hemoglobin: 0 mL

## 2018-05-31 LAB — BPAM RBC
BLOOD PRODUCT EXPIRATION DATE: 201909232359
Blood Product Expiration Date: 201909222359
UNIT TYPE AND RH: 9500
Unit Type and Rh: 9500

## 2018-05-31 SURGERY — LIGATION, FALLOPIAN TUBE, POSTPARTUM
Anesthesia: Choice

## 2018-05-31 MED ORDER — SODIUM CHLORIDE 0.9% FLUSH
INTRAVENOUS | Status: AC
  Filled 2018-05-31: qty 3

## 2018-05-31 MED ORDER — KETOROLAC TROMETHAMINE 30 MG/ML IJ SOLN
30.0000 mg | Freq: Once | INTRAMUSCULAR | Status: AC | PRN
  Administered 2018-05-31: 30 mg via INTRAVENOUS

## 2018-05-31 MED ORDER — LACTATED RINGERS IV SOLN
INTRAVENOUS | Status: DC
  Administered 2018-05-31: 12:00:00 via INTRAVENOUS

## 2018-05-31 MED ORDER — PROPOFOL 500 MG/50ML IV EMUL
INTRAVENOUS | Status: DC | PRN
  Administered 2018-05-31: 100 ug/kg/min via INTRAVENOUS

## 2018-05-31 MED ORDER — RHO D IMMUNE GLOBULIN 1500 UNIT/2ML IJ SOSY
300.0000 ug | PREFILLED_SYRINGE | Freq: Once | INTRAMUSCULAR | Status: AC
  Administered 2018-05-31: 300 ug via INTRAVENOUS
  Filled 2018-05-31: qty 2

## 2018-05-31 MED ORDER — PROMETHAZINE HCL 25 MG/ML IJ SOLN: 6.2500 mg | INTRAMUSCULAR | Status: DC | PRN

## 2018-05-31 MED ORDER — HYDROMORPHONE HCL 1 MG/ML IJ SOLN
0.2500 mg | INTRAMUSCULAR | Status: DC | PRN
  Administered 2018-05-31: 0.25 mg via INTRAVENOUS
  Administered 2018-05-31 (×2): 0.5 mg via INTRAVENOUS

## 2018-05-31 MED ORDER — SODIUM BICARBONATE 8.4 % IV SOLN
INTRAVENOUS | Status: DC | PRN
  Administered 2018-05-31 (×3): 5 mL via EPIDURAL

## 2018-05-31 MED ORDER — BUPIVACAINE HCL (PF) 0.25 % IJ SOLN
INTRAMUSCULAR | Status: AC
  Filled 2018-05-31: qty 30

## 2018-05-31 MED ORDER — METOCLOPRAMIDE HCL 10 MG PO TABS
10.0000 mg | ORAL_TABLET | Freq: Once | ORAL | Status: AC
  Administered 2018-05-31: 10 mg via ORAL
  Filled 2018-05-31: qty 1

## 2018-05-31 MED ORDER — BUPIVACAINE HCL (PF) 0.25 % IJ SOLN
INTRAMUSCULAR | Status: DC | PRN
  Administered 2018-05-31: 20 mL

## 2018-05-31 MED ORDER — PROPOFOL 10 MG/ML IV BOLUS
INTRAVENOUS | Status: AC
  Filled 2018-05-31: qty 60

## 2018-05-31 MED ORDER — FAMOTIDINE 20 MG PO TABS
40.0000 mg | ORAL_TABLET | Freq: Once | ORAL | Status: AC
  Administered 2018-05-31: 40 mg via ORAL
  Filled 2018-05-31: qty 2

## 2018-05-31 MED ORDER — OXYCODONE-ACETAMINOPHEN 5-325 MG PO TABS
1.0000 | ORAL_TABLET | Freq: Four times a day (QID) | ORAL | Status: DC | PRN
  Administered 2018-05-31 – 2018-06-01 (×3): 1 via ORAL
  Filled 2018-05-31 (×3): qty 1

## 2018-05-31 MED ORDER — KETOROLAC TROMETHAMINE 30 MG/ML IJ SOLN
INTRAMUSCULAR | Status: AC
  Filled 2018-05-31: qty 1

## 2018-05-31 MED ORDER — BUPIVACAINE HCL (PF) 0.5 % IJ SOLN
INTRAMUSCULAR | Status: AC
  Filled 2018-05-31: qty 30

## 2018-05-31 MED ORDER — SODIUM BICARBONATE 8.4 % IV SOLN
INTRAVENOUS | Status: DC | PRN
  Administered 2018-05-30: 3 mL via EPIDURAL

## 2018-05-31 MED ORDER — HYDROMORPHONE HCL 1 MG/ML IJ SOLN
INTRAMUSCULAR | Status: AC
  Filled 2018-05-31: qty 1

## 2018-05-31 MED ORDER — HYDROMORPHONE HCL 1 MG/ML IJ SOLN
INTRAMUSCULAR | Status: AC
  Filled 2018-05-31: qty 0.5

## 2018-05-31 MED ORDER — MEPERIDINE HCL 25 MG/ML IJ SOLN: 6.2500 mg | INTRAMUSCULAR | Status: DC | PRN

## 2018-05-31 SURGICAL SUPPLY — 25 items
BLADE SURG 11 STRL SS (BLADE) ×3 IMPLANT
CLOTH BEACON ORANGE TIMEOUT ST (SAFETY) ×3 IMPLANT
DERMABOND ADVANCED (GAUZE/BANDAGES/DRESSINGS) ×2
DERMABOND ADVANCED .7 DNX12 (GAUZE/BANDAGES/DRESSINGS) ×1 IMPLANT
DRSG OPSITE POSTOP 3X4 (GAUZE/BANDAGES/DRESSINGS) ×3 IMPLANT
DURAPREP 26ML APPLICATOR (WOUND CARE) ×3 IMPLANT
GLOVE BIOGEL PI IND STRL 7.0 (GLOVE) ×1 IMPLANT
GLOVE BIOGEL PI IND STRL 7.5 (GLOVE) ×1 IMPLANT
GLOVE BIOGEL PI INDICATOR 7.0 (GLOVE) ×2
GLOVE BIOGEL PI INDICATOR 7.5 (GLOVE) ×2
GLOVE ECLIPSE 7.5 STRL STRAW (GLOVE) ×3 IMPLANT
GOWN STRL REUS W/TWL LRG LVL3 (GOWN DISPOSABLE) ×6 IMPLANT
NEEDLE HYPO 22GX1.5 SAFETY (NEEDLE) ×3 IMPLANT
NS IRRIG 1000ML POUR BTL (IV SOLUTION) ×3 IMPLANT
PACK ABDOMINAL MINOR (CUSTOM PROCEDURE TRAY) ×3 IMPLANT
PROTECTOR NERVE ULNAR (MISCELLANEOUS) ×3 IMPLANT
SPONGE LAP 4X18 X RAY DECT (DISPOSABLE) IMPLANT
SUT PLAIN 2 0 (SUTURE) ×4
SUT PLAIN ABS 2-0 54XMFL TIE (SUTURE) ×2 IMPLANT
SUT VICRYL 0 UR6 27IN ABS (SUTURE) ×3 IMPLANT
SUT VICRYL 4-0 PS2 18IN ABS (SUTURE) ×3 IMPLANT
SYR CONTROL 10ML LL (SYRINGE) ×3 IMPLANT
TOWEL OR 17X24 6PK STRL BLUE (TOWEL DISPOSABLE) ×6 IMPLANT
TRAY FOLEY CATH SILVER 14FR (SET/KITS/TRAYS/PACK) ×3 IMPLANT
WATER STERILE IRR 1000ML POUR (IV SOLUTION) ×3 IMPLANT

## 2018-05-31 NOTE — Progress Notes (Signed)
Post Partum Day 1 Subjective: Doing well, no complaints. Some pain from BTL.    Objective: Blood pressure 126/87, pulse 70, temperature 98 F (36.7 C), temperature source Oral, resp. rate 20, height 5\' 5"  (1.651 m), weight 105.7 kg, last menstrual period 09/13/2017, SpO2 98 %, unknown if currently breastfeeding.  Physical Exam:  General: alert and cooperative, NAD Lochia: appropriate Uterine Fundus: firm Incision: honeycomb dressing in place inferior to umbilicus DVT Evaluation: No evidence of DVT seen on physical exam.  Recent Labs    05/30/18 0555 05/31/18 0531  HGB 11.6* 11.2*  HCT 34.3* 33.2*    Assessment/Plan: Plan for discharge tomorrow  POD#0 today from BTL, pain well controlled Continue breastfeeding support    LOS: 2 days   Tamera Stands, DO 05/31/2018, 5:05 PM

## 2018-05-31 NOTE — Lactation Note (Signed)
This note was copied from a baby's chart. Lactation Consultation Note  Patient Name: Rachel Campos VCBSW'H Date: 05/31/2018 Reason for consult: Initial assessment;Early term 37-38.6wks P2, ETI, 29 hour female infant. Per mom, has DEBP at home. She BF her eldest son for 3 months. Per mom though milk dried up pumped previously had EBM but none today, she has been pumping every 3 hours. Infant been supplemented with formula not latching to breast recently. LC unable observe latch due mom giving formula prior to [facility] entering room. Mom hand expressed 48ml of colostrum. Mom feels better that she has breast milk. Mom will call Nurse or LC for assistance with latching infant to breast. LC discussed ETI behaviors and feeding guidelines. Mom plans to breast feed, then give EBM and supplement with formula. LC discussed I&O. Reviewed Baby & Me book's Breastfeeding Basics.  Mom knows to pump q3h for 15-20 min. LC discussed : LC hotline, BF support groups, LC outpatient clinic, LC hotline and local community BF groups,   Maternal Data Formula Feeding for Exclusion: No Has patient been taught Hand Expression?: Yes(mom expressed 1 ml of colostrum) Does the patient have breastfeeding experience prior to this delivery?: Yes  Feeding Feeding Type: Bottle Fed - Formula Nipple Type: Slow - flow  LATCH Score                   Interventions Interventions: Hand express;Breast feeding basics reviewed  Lactation Tools Discussed/Used WIC Program: No   Consult Status      Danelle Earthly 05/31/2018, 11:41 PM

## 2018-05-31 NOTE — Transfer of Care (Signed)
Immediate Anesthesia Transfer of Care Note  Patient: Rachel Campos  Procedure(s) Performed: Postpartum tubal ligation  Patient Location: PACU  Anesthesia Type:Epidural  Level of Consciousness: awake, alert  and oriented  Airway & Oxygen Therapy: Patient Spontanous Breathing  Post-op Assessment: Report given to RN and Post -op Vital signs reviewed and stable  Post vital signs: Reviewed and stable  Last Vitals:  Vitals Value Taken Time  BP    Temp    Pulse    Resp    SpO2      Last Pain:  Vitals:   05/31/18 0900  TempSrc: Oral  PainSc:          Complications: No apparent anesthesia complications

## 2018-05-31 NOTE — Progress Notes (Signed)
Epidural Catheter pulled. Tip intact

## 2018-05-31 NOTE — Op Note (Signed)
Rachel Campos 05/29/2018 - 05/31/2018 12:50 PM  PREOPERATIVE DIAGNOSIS:  Undesired fertility POSTOPERATIVE DIAGNOSIS:  Undesired fertility PROCEDURE:  Postpartum Bilateral Tubal Sterilization using Pomeroy method   SURGEON: Surgeon(s) and Role:    * Stinson, Rhona Raider, DO - Primary    * Tamera Stands, DO - Fellow - Attending           Nolene Ebbs, MD - OB Fellow  ANESTHESIA:  Epidural COMPLICATIONS:  None immediate. ESTIMATED BLOOD LOSS:  Less than 20cc. FLUIDS: 600 cc LR.  URINE OUTPUT:  Catheter to be placed post-procedure  INDICATIONS: 22 y.o. yo G2P2002  with undesired fertility,status post vaginal delivery, desires permanent sterilization. Risks and benefits of procedure discussed with patient including permanence of method, bleeding, infection, injury to surrounding organs and need for additional procedures. Risk failure of 0.5-1% with increased risk of ectopic gestation if pregnancy occurs was also discussed with patient.   FINDINGS:  Normal uterus, tubes, and ovaries.  TECHNIQUE: After informed consent was obtained, the patient was taken to the operating room where anesthesia was induced and found to be adequate. Quarter percent Marcaine solution was then injected at the incision site. A small transverse, infraumbilical skin incision was made with the scalpel. This incision was carried down to the underlying layer of fascia. The fascia was grasped with Kocher clamps tented up and entered sharply with Mayo scissors. Underlying peritoneum was then identified tented up and entered sharply with Metzenbaum scissors. The fascia was tagged with Kocher clamps. The patient's right fallopian tube was then identified, brought to the incision, and grasped with a Babcock clamp. The tube was then followed out to the fimbria. The Babcock clamp was then used to grasp the tube approximately 4 cm from the cornual region. A 3 cm segment of the tube was then ligated with free tie of plain gut suture and an  additional ligature was placed just below, the area in between was then transected and the free tubal portion excised. Good hemostasis was noted and the tube was returned to the abdomen. The left fallopian tube was then identified to its fimbriated end, ligated, and a 3 cm segment excised in a similar fashion. Excellent hemostasis was noted, and the tube returned to the abdomen. The fascia was re-approximated with 0 Vicryl. The skin was closed in a subcuticular fashion with 3-0 Vicryl.  The patient tolerated the procedure well. Sponge, lap, and needle count were correct x2. The patient was taken to recovery room in stable condition.  Cristal Deer. Earlene Plater, DO OB/GYN Fellow

## 2018-05-31 NOTE — Anesthesia Postprocedure Evaluation (Signed)
Anesthesia Post Note  Patient: Rachel Campos  Procedure(s) Performed: POST PARTUM TUBAL LIGATION (N/A )     Patient location during evaluation: PACU Anesthesia Type: Epidural Level of consciousness: awake Pain management: pain level controlled Vital Signs Assessment: post-procedure vital signs reviewed and stable Respiratory status: spontaneous breathing Cardiovascular status: stable Postop Assessment: no headache, no backache, epidural receding, patient able to bend at knees and no apparent nausea or vomiting Anesthetic complications: no    Last Vitals:  Vitals:   05/31/18 1330 05/31/18 1345  BP: 110/78 110/78  Pulse: 74   Resp: 16   Temp:    SpO2: 100%     Last Pain:  Vitals:   05/31/18 1345  TempSrc:   PainSc: 3    Pain Goal:                 Orchid Glassberg JR,JOHN Berlene Dixson

## 2018-05-31 NOTE — Anesthesia Postprocedure Evaluation (Signed)
Anesthesia Post Note  Patient: Rachel Campos  Procedure(s) Performed: AN AD HOC LABOR EPIDURAL     Patient location during evaluation: Mother Baby Anesthesia Type: Epidural Level of consciousness: awake, awake and alert and oriented Pain management: pain level controlled Vital Signs Assessment: post-procedure vital signs reviewed and stable Respiratory status: spontaneous breathing, nonlabored ventilation and respiratory function stable Cardiovascular status: stable Postop Assessment: no headache, no backache, patient able to bend at knees, no apparent nausea or vomiting, adequate PO intake and able to ambulate Anesthetic complications: no    Last Vitals:  Vitals:   05/31/18 0111 05/31/18 0540  BP: 128/82 122/70  Pulse: 77 70  Resp: 16 16  Temp: 36.9 C   SpO2:      Last Pain:  Vitals:   05/31/18 0611  TempSrc:   PainSc: 3    Pain Goal:                 [name]

## 2018-05-31 NOTE — Progress Notes (Signed)
MOB was referred for history of depression/anxiety. * Referral screened out by Clinical Social Worker because none of the following criteria appear to apply: ~ History of anxiety/depression during this pregnancy, or of post-partum depression following prior delivery. No concerns noted in OB record.   ~ Diagnosis of anxiety and/or depression within last 3 years OR * MOB's symptoms currently being treated with medication and/or therapy.  Please contact the Clinical Social Worker if needs arise, by MOB request, or if MOB scores greater than 9/yes to question 10 on Edinburgh Postpartum Depression Screen.  Mylani Gentry Boyd-Gilyard, MSW, LCSW Clinical Social Work [phone] 

## 2018-05-31 NOTE — Progress Notes (Signed)
Post Partum Day 1 Subjective: no complaints, up ad lib and voiding  Objective: Blood pressure 122/70, pulse 70, temperature 98.4 F (36.9 C), temperature source Oral, resp. rate 16, height 5\' 5"  (1.651 m), weight 105.7 kg, last menstrual period 09/13/2017, SpO2 98 %, unknown if currently breastfeeding.  Physical Exam:  General: alert, cooperative and no distress Lochia: appropriate Uterine Fundus: firm DVT Evaluation: No evidence of DVT seen on physical exam. Negative Homan's sign. No cords or calf tenderness.  Recent Labs    05/30/18 0555 05/31/18 0531  HGB 11.6* 11.2*  HCT 34.3* 33.2*    Assessment/Plan: Plan for discharge tomorrow and Breastfeeding  Risks of procedure discussed with patient including but not limited to: risk of regret, permanence of method, bleeding, infection, injury to surrounding organs and need for additional procedures.  Failure risk of 1 -2 % with increased risk of ectopic gestation if pregnancy occurs was also discussed with patient.    Levie Heritage, DO 05/31/2018 10:06 AM     LOS: 2 days   Levie Heritage 05/31/2018, 10:06 AM

## 2018-06-01 LAB — RH IG WORKUP (INCLUDES ABO/RH)
ABO/RH(D): B NEG
Gestational Age(Wks): 37
UNIT DIVISION: 0

## 2018-06-01 MED ORDER — ACETAMINOPHEN 325 MG PO TABS: 650.0000 mg | ORAL_TABLET | ORAL | 0 refills | Status: DC | PRN

## 2018-06-01 MED ORDER — OXYCODONE-ACETAMINOPHEN 5-325 MG PO TABS: 1.0000 | ORAL_TABLET | Freq: Four times a day (QID) | ORAL | 0 refills | Status: DC | PRN

## 2018-06-01 MED ORDER — IBUPROFEN 600 MG PO TABS: 600.0000 mg | ORAL_TABLET | Freq: Four times a day (QID) | ORAL | 0 refills | Status: DC

## 2018-06-01 NOTE — Discharge Summary (Signed)
Obstetrics Discharge Summary OB/GYN Faculty [facility]   Patient Name: Stephaniemarie Stoffel DOB: 1995-10-20 MRN: [id number]  Date of admission: 05/29/2018 Delivering MD: Mirian Mo   Date of discharge: 06/01/2018  Admitting diagnosis: Abdominal pain in pregnancy, third trimester [O26.893, R10.9] Intrauterine pregnancy: [redacted]w[redacted]d     Secondary diagnosis:   Active Problems:   Cholestasis of pregnancy   Additional problems:  . S/p fall w/ direct abdominal trauma . S/p BTL     Discharge diagnosis: Term Pregnancy Delivered , Permanent sterilization                                           Postpartum procedures: BTL Complications: none  Hospital course: Cydne Grahn is a 22 y.o. [redacted]w[redacted]d who was admitted for IOL for cholestasis of pregnancy. Her pregnancy was complicated by uncomplicated. Her labor course was uncomplicated. Delivery was uncomplicated. Please see delivery/op note for additional details. Her postpartum course was uncomplicated. She was breastfeeding without difficulty. By day of discharge, she was passing flatus, urinating, eating and drinking without difficulty. Her pain was well-controlled, and she was discharged home with tylenol/motrin. She will follow-up in clinic in 4 weeks.   Physical exam  Vitals:   05/31/18 1440 05/31/18 1600 05/31/18 2000 06/01/18 0106  BP: 126/87 123/68 116/63 113/68  Pulse: 70 63 75 70  Resp: 20 18 18 16   Temp:  97.7 F (36.5 C) 98.5 F (36.9 C) 97.7 F (36.5 C)  TempSrc:   Oral Oral  SpO2: 98% 100% 98% 100%  Weight:      Height:       General: AAOx3, NAD Lochia: appropriate Uterine Fundus: firm Incision: Dressing is clean, dry, and intact DVT Evaluation: No evidence of DVT seen on physical exam. Labs: Lab Results  Component Value Date   WBC 11.6 (H) 05/31/2018   HGB 11.2 (L) 05/31/2018   HCT 33.2 (L) 05/31/2018   MCV 89.2 05/31/2018   PLT 191 05/31/2018   CMP Latest Ref Rng & Units 05/30/2018  Glucose 70 - 99 mg/dL 98  BUN 6 - 20 mg/dL 6   Creatinine 4.09 - 8.11 mg/dL 9.14(N)  Sodium 829 - 562 mmol/L 134(L)  Potassium 3.5 - 5.1 mmol/L 3.9  Chloride 98 - 111 mmol/L 104  CO2 22 - 32 mmol/L 20(L)  Calcium 8.9 - 10.3 mg/dL 1.3(Y)  Total Protein 6.5 - 8.1 g/dL 8.6(V)  Total Bilirubin 0.3 - 1.2 mg/dL 0.6  Alkaline Phos 38 - 126 U/L 86  AST 15 - 41 U/L 14(L)  ALT 0 - 44 U/L 15    Discharge instructions: Per After Visit Summary and "Baby and Me Booklet"  After visit meds:  Allergies as of 06/01/2018   No Known Allergies     Medication List    TAKE these medications   acetaminophen 325 MG tablet Commonly known as:  TYLENOL Take 2 tablets (650 mg total) by mouth every 4 (four) hours as needed (for pain scale < 4).   clotrimazole 1 % vaginal cream Commonly known as:  GYNE-LOTRIMIN Place 1 Applicatorful vaginally at bedtime.   clotrimazole 1 % vaginal cream Commonly known as:  GYNE-LOTRIMIN Place 1 Applicatorful vaginally at bedtime.   ibuprofen 600 MG tablet Commonly known as:  ADVIL,MOTRIN Take 1 tablet (600 mg total) by mouth every 6 (six) hours.   ursodiol 250 MG tablet Commonly known as:  ACTIGALL Take 1 tablet (250  mg total) by mouth 3 (three) times daily.   VITAFOL-NANO 18-0.6-0.4 MG Tabs Take 1 tablet by mouth daily.   Vitamin D (Ergocalciferol) 50000 units Caps capsule Commonly known as:  DRISDOL Take 1 capsule (50,000 Units total) by mouth every 7 (seven) days.       Postpartum contraception: Tubal Ligation Diet: Routine Diet Activity: Advance as tolerated. Pelvic rest for 6 weeks.   Outpatient follow up:4 wks Follow-up Appt: Future Appointments  Date Time Provider Department Center  06/27/2018  1:00 PM Constant, Gigi Gin, MD [facility] None   Follow-up Visit:No follow-ups on file.  Newborn Data: Live born female  Birth Weight: 7 lb 5.5 oz (3331 g) APGAR: 9, 9  Newborn Delivery   Birth date/time:  05/30/2018 18:16:00 Delivery type:  Vaginal, Spontaneous     Baby Feeding: Breast and  bottle Disposition:home with mother

## 2018-06-01 NOTE — Discharge Instructions (Signed)
Laparoscopic Tubal Ligation, Care After °Refer to this sheet in the next few weeks. These instructions provide you with information about caring for yourself after your procedure. Your health care provider may also give you more specific instructions. Your treatment has been planned according to current medical practices, but problems sometimes occur. Call your health care provider if you have any problems or questions after your procedure. °What can I expect after the procedure? °After the procedure, it is common to have: °· A sore throat. °· Discomfort in your shoulder. °· Mild discomfort or cramping in your abdomen. °· Gas pains. °· Pain or soreness in the area where the surgical cut (incision) was made. °· A bloated feeling. °· Tiredness. °· Nausea. °· Vomiting. ° °Follow these instructions at home: °Medicines °· Take over-the-counter and prescription medicines only as told by your health care provider. °· Do not take aspirin because it can cause bleeding. °· Do not drive or operate heavy machinery while taking prescription pain medicine. °Activity °· Rest for the rest of the day. °· Return to your normal activities as told by your health care provider. Ask your health care provider what activities are safe for you. °Incision care ° °· Follow instructions from your health care provider about how to take care of your incision. Make sure you: °? Wash your hands with soap and water before you change your bandage (dressing). If soap and water are not available, use hand sanitizer. °? Change your dressing as told by your health care provider. °? Leave stitches (sutures) in place. They may need to stay in place for 2 weeks or longer. °· Check your incision area every day for signs of infection. Check for: °? More redness, swelling, or pain. °? More fluid or blood. °? Warmth. °? Pus or a bad smell. °Other Instructions °· Do not take baths, swim, or use a hot tub until your health care provider approves. You may take  showers. °· Keep all follow-up visits as told by your health care provider. This is important. °· Have someone help you with your daily household tasks for the first few days. °Contact a health care provider if: °· You have more redness, swelling, or pain around your incision. °· Your incision feels warm to the touch. °· You have pus or a bad smell coming from your incision. °· The edges of your incision break open after the sutures have been removed. °· Your pain does not improve after 2-3 days. °· You have a rash. °· You repeatedly become dizzy or light-headed. °· Your pain medicine is not helping. °· You are constipated. °Get help right away if: °· You have a fever. °· You faint. °· You have increasing pain in your abdomen. °· You have severe pain in one or both of your shoulders. °· You have fluid or blood coming from your sutures or from your vagina. °· You have shortness of breath or difficulty breathing. °· You have chest pain or leg pain. °· You have ongoing nausea, vomiting, or diarrhea. °This information is not intended to replace advice given to you by your health care provider. Make sure you discuss any questions you have with your health care provider. °Document Released: 03/27/2005 Document Revised: 02/10/2016 Document Reviewed: 08/18/2015 °Elsevier Interactive Patient Education © 2018 [employer] Inc[employer] °Vaginal Delivery, Care After °Refer to this sheet in the next few weeks. These instructions provide you with information about caring for yourself after vaginal delivery. Your health care provider may also give you more   specific instructions. Your treatment has been planned according to current medical practices, but problems sometimes occur. Call your health care provider if you have any problems or questions. What can I expect after the procedure? After vaginal delivery, it is common to have:  Some bleeding from your vagina.  Soreness in your abdomen, your vagina, and the area of skin between your  vaginal opening and your anus (perineum).  Pelvic cramps.  Fatigue.  Follow these instructions at home: Medicines  Take over-the-counter and prescription medicines only as told by your health care provider.  If you were prescribed an antibiotic medicine, take it as told by your health care provider. Do not stop taking the antibiotic until it is finished. Driving   Do not drive or operate heavy machinery while taking prescription pain medicine.  Do not drive for 24 hours if you received a sedative. Lifestyle  Do not drink alcohol. This is especially important if you are breastfeeding or taking medicine to relieve pain.  Do not use tobacco products, including cigarettes, chewing tobacco, or e-cigarettes. If you need help quitting, ask your health care provider. Eating and drinking  Drink at least 8 eight-ounce glasses of water every day unless you are told not to by your health care provider. If you choose to breastfeed your baby, you may need to drink more water than this.  Eat high-fiber foods every day. These foods may help prevent or relieve constipation. High-fiber foods include: ? Whole grain cereals and breads. ? Brown rice. ? Beans. ? Fresh fruits and vegetables. Activity  Return to your normal activities as told by your health care provider. Ask your health care provider what activities are safe for you.  Rest as much as possible. Try to rest or take a nap when your baby is sleeping.  Do not lift anything that is heavier than your baby or 10 lb (4.5 kg) until your health care provider says that it is safe.  Talk with your health care provider about when you can engage in sexual activity. This may depend on your: ? Risk of infection. ? Rate of healing. ? Comfort and desire to engage in sexual activity. Vaginal Care  If you have an episiotomy or a vaginal tear, check the area every day for signs of infection. Check for: ? More redness, swelling, or pain. ? More  fluid or blood. ? Warmth. ? Pus or a bad smell.  Do not use tampons or douches until your health care provider says this is safe.  Watch for any blood clots that may pass from your vagina. These may look like clumps of dark red, brown, or black discharge. General instructions  Keep your perineum clean and dry as told by your health care provider.  Wear loose, comfortable clothing.  Wipe from front to back when you use the toilet.  Ask your health care provider if you can shower or take a bath. If you had an episiotomy or a perineal tear during labor and delivery, your health care provider may tell you not to take baths for a certain length of time.  Wear a bra that supports your breasts and fits you well.  If possible, have someone help you with household activities and help care for your baby for at least a few days after you leave the hospital.  Keep all follow-up visits for you and your baby as told by your health care provider. This is important. Contact a health care provider if:  You have: ?  Vaginal discharge that has a bad smell. ? Difficulty urinating. ? Pain when urinating. ? A sudden increase or decrease in the frequency of your bowel movements. ? More redness, swelling, or pain around your episiotomy or vaginal tear. ? More fluid or blood coming from your episiotomy or vaginal tear. ? Pus or a bad smell coming from your episiotomy or vaginal tear. ? A fever. ? A rash. ? Little or no interest in activities you used to enjoy. ? Questions about caring for yourself or your baby.  Your episiotomy or vaginal tear feels warm to the touch.  Your episiotomy or vaginal tear is separating or does not appear to be healing.  Your breasts are painful, hard, or turn red.  You feel unusually sad or worried.  You feel nauseous or you vomit.  You pass large blood clots from your vagina. If you pass a blood clot from your vagina, save it to show to your health care provider. Do  not flush blood clots down the toilet without having your health care provider look at them.  You urinate more than usual.  You are dizzy or light-headed.  You have not breastfed at all and you have not had a menstrual period for 12 weeks after delivery.  You have stopped breastfeeding and you have not had a menstrual period for 12 weeks after you stopped breastfeeding. Get help right away if:  You have: ? Pain that does not go away or does not get better with medicine. ? Chest pain. ? Difficulty breathing. ? Blurred vision or spots in your vision. ? Thoughts about hurting yourself or your baby.  You develop pain in your abdomen or in one of your legs.  You develop a severe headache.  You faint.  You bleed from your vagina so much that you fill two sanitary pads in one hour. This information is not intended to replace advice given to you by your health care provider. Make sure you discuss any questions you have with your health care provider. Document Released: 09/04/2000 Document Revised: 02/19/2016 Document Reviewed: 09/22/2015 Elsevier Interactive Patient Education  2018 [employer]

## 2018-06-01 NOTE — Anesthesia Postprocedure Evaluation (Signed)
Anesthesia Post Note  Patient: Rachel Campos  Procedure(s) Performed: Postpartum tubal ligation     Patient location during evaluation: PACU Level of consciousness: awake Pain management: pain level controlled Vital Signs Assessment: post-procedure vital signs reviewed and stable Respiratory status: spontaneous breathing Cardiovascular status: stable Postop Assessment: no headache, no backache, epidural receding, patient able to bend at knees and no apparent nausea or vomiting Anesthetic complications: no    Last Vitals:  Vitals:   06/01/18 0106 06/01/18 0615  BP: 113/68 127/80  Pulse: 70 72  Resp: 16 18  Temp: 36.5 C 36.6 C  SpO2: 100% 98%    Last Pain:  Vitals:   06/01/18 0615  TempSrc: Oral  PainSc: 5    Pain Goal: Patients Stated Pain Goal: 0 (06/01/18 0615)               Vincy Feliz JR,JOHN Susann Givens

## 2018-06-01 NOTE — Anesthesia Postprocedure Evaluation (Signed)
Anesthesia Post Note  Patient: Rachel Campos  Procedure(s) Performed: POST PARTUM TUBAL LIGATION (N/A )     Patient location during evaluation: Mother Baby Anesthesia Type: Epidural Level of consciousness: awake and alert Pain management: pain level controlled Vital Signs Assessment: post-procedure vital signs reviewed and stable Respiratory status: spontaneous breathing, nonlabored ventilation and respiratory function stable Cardiovascular status: stable Postop Assessment: no headache, no backache, epidural receding, patient able to bend at knees, no apparent nausea or vomiting, adequate PO intake and able to ambulate Anesthetic complications: no    Last Vitals:  Vitals:   06/01/18 0106 06/01/18 0615  BP: 113/68 127/80  Pulse: 70 72  Resp: 16 18  Temp: 36.5 C 36.6 C  SpO2: 100% 98%    Last Pain:  Vitals:   06/01/18 0615  TempSrc: Oral  PainSc: 5    Pain Goal: Patients Stated Pain Goal: 0 (06/01/18 0615)               Fanny Dance

## 2018-06-02 ENCOUNTER — Other Ambulatory Visit: Payer: Self-pay | Admitting: Family Medicine

## 2018-06-02 ENCOUNTER — Encounter (HOSPITAL_COMMUNITY): Payer: Self-pay | Admitting: Family Medicine

## 2018-06-02 NOTE — Anesthesia Postprocedure Evaluation (Signed)
Anesthesia Post Note  Patient: Rachel BurowOlga Gago  Procedure(s) Performed: AN AD HOC LABOR EPIDURAL     Patient location during evaluation: Mother Baby Anesthesia Type: Epidural Level of consciousness: awake and alert Pain management: pain level controlled Vital Signs Assessment: post-procedure vital signs reviewed and stable Respiratory status: spontaneous breathing, nonlabored ventilation and respiratory function stable Cardiovascular status: stable Postop Assessment: no headache, no backache and epidural receding Anesthetic complications: no    Last Vitals: There were no vitals filed for this visit.  Last Pain: There were no vitals filed for this visit.               Arieona Swaggerty L Deshone Lyssy

## 2018-06-02 NOTE — Anesthesia Preprocedure Evaluation (Addendum)
Anesthesia Evaluation  Patient identified by MRN, date of birth, ID band Patient awake    Reviewed: Allergy & Precautions, H&P , NPO status , Patient's Chart, lab work & pertinent test results  History of Anesthesia Complications Negative for: history of anesthetic complications  Airway Mallampati: II  TM Distance: >3 FB Neck ROM: full    Dental no notable dental hx. (+) Teeth Intact   Pulmonary neg pulmonary ROS, former smoker,    Pulmonary exam normal breath sounds clear to auscultation       Cardiovascular negative cardio ROS Normal cardiovascular exam Rhythm:regular Rate:Normal     Neuro/Psych negative neurological ROS  negative psych ROS   GI/Hepatic negative GI ROS, Neg liver ROS,   Endo/Other  negative endocrine ROS  Renal/GU negative Renal ROS  negative genitourinary   Musculoskeletal   Abdominal   Peds  Hematology negative hematology ROS (+)   Anesthesia Other Findings   Reproductive/Obstetrics (+) Pregnancy                             Anesthesia Physical  Anesthesia Plan  ASA: II  Anesthesia Plan: Epidural   Post-op Pain Management:    Induction:   PONV Risk Score and Plan: 3  Airway Management Planned: Natural Airway and Nasal Cannula  Additional Equipment:   Intra-op Plan:   Post-operative Plan:   Informed Consent: I have reviewed the patients History and Physical, chart, labs and discussed the procedure including the risks, benefits and alternatives for the proposed anesthesia with the patient or authorized representative who has indicated his/her understanding and acceptance.     Plan Discussed with: CRNA and Surgeon  Anesthesia Plan Comments:         Anesthesia Quick Evaluation

## 2018-06-03 MED ORDER — OXYCODONE-ACETAMINOPHEN 5-325 MG PO TABS: 1.0000 | ORAL_TABLET | Freq: Four times a day (QID) | ORAL | 0 refills | Status: DC | PRN

## 2018-06-17 NOTE — Addendum Note (Signed)
Addendum  created 06/17/18 1314 by Elmer Picker, MD   Intraprocedure Blocks edited, Sign clinical note

## 2018-06-21 ENCOUNTER — Telehealth: Payer: Self-pay | Admitting: *Deleted

## 2018-06-21 NOTE — Telephone Encounter (Signed)
Pt called to office to inquire about returning to work.  Return call to pt.  Pt has PP appt scheduled on 06/23/18. Pt was made aware she needs to keep this appt and return to work can be discussed at that time. Pt will need approval from provider in order to return to work. Pt advised that if approved she will be given Return to work note at that visit.

## 2018-06-23 ENCOUNTER — Encounter: Payer: Self-pay | Admitting: Certified Nurse Midwife

## 2018-06-23 ENCOUNTER — Ambulatory Visit (INDEPENDENT_AMBULATORY_CARE_PROVIDER_SITE_OTHER): Payer: BLUE CROSS/BLUE SHIELD | Admitting: Certified Nurse Midwife

## 2018-06-23 DIAGNOSIS — Z9851 Tubal ligation status: Secondary | ICD-10-CM

## 2018-06-23 NOTE — Progress Notes (Signed)
Post Partum Exam  Rachel Campos is a 22 y.o. [id number] female who presents for a postpartum visit. She is 3 weeks postpartum following a spontaneous vaginal delivery. I have fully reviewed the prenatal and intrapartum course. The delivery was at 37.0 gestational weeks.  Anesthesia: epidural. Postpartum course has been uncomplicated. Baby's course has been uncomplicated. Baby is feeding by bottle - Soy. Bleeding no bleeding. Bowel function is normal. Bladder function is normal. Patient is not sexually active. Contraception method is tubal ligation. Postpartum depression screening:neg  The following portions of the patient's history were reviewed and updated as appropriate: allergies, current medications, past surgical history and problem list. Last pap smear done 11/22/2017 and was Normal  Review of Systems Pertinent items noted in HPI and remainder of comprehensive ROS otherwise negative.    Objective:  Last menstrual period 09/13/2017, unknown if currently breastfeeding.  General:  alert, cooperative and no distress   Breasts:  inspection negative, no nipple discharge or bleeding, no masses or nodularity palpable  Lungs: clear to auscultation bilaterally  Heart:  regular rate and rhythm and S1, S2 normal  Abdomen: soft, non-tender; bowel sounds normal; no masses,  no organomegaly   Vulva:  not evaluated  Vagina: not evaluated  Cervix:  not evaluated  Corpus: not examined  Adnexa:  not evaluated  Rectal Exam: Not performed.        Assessment:    -Normal postpartum exam. Pap smear not done at today's visit. Pap 11/2017- negative for abnormalities.  -S/P postpartum tubal ligation, no complaints or concerns. 2cm incision under umbilicus healed - Patient plans to return to work this weekend. Cleared to return to work.   Plan:   1. Contraception: tubal ligation 2. Follow up in: 1 year for annual wellness examination or as needed.  3. Work note given to patient for clearance    Sharyon Cable, CNM 06/23/18

## 2018-06-23 NOTE — Patient Instructions (Signed)
What You Need to Know About Female Sterilization Female sterilization is surgery to prevent pregnancy. In this surgery, the fallopian tubes are either blocked or closed off. This prevents eggs from reaching the uterus so that the eggs cannot be fertilized by sperm and you cannot get pregnant. Sterilization is permanent. It should only be done if you are sure that you do not want to be able to have children. What are the sterilization surgery options? There are several kinds of female sterilization surgeries. They include:  Laparoscopic tubal ligation. In this surgery, the fallopian tubes are tied off, sealed with heat, or blocked with a clip, ring, or clamp. A small portion of each fallopian tube may also be removed. This surgery is done through several small cuts (incisions).  Postpartum tubal ligation. This is also called a mini-laparotomy. This surgery is done right after childbirth or 1 or 2 days after childbirth. In this surgery, the fallopian tubes are tied off, sealed with heat, or blocked with a clip, ring, or clamp. A small portion of each fallopian tube may also be removed. The surgery is done through a single incision.  Hysteroscopic sterilization. In this surgery, a tiny, spring-like coil is inserted through the cervix and uterus into the fallopian tubes. The coil causes scarring, which blocks the tubes. After the surgery, contraception should be used for 3 months to allow the scar tissue to form completely.  Is sterilization safe? Generally, sterilization is safe. Complications are rare. However, there are risks. They include:  Bleeding.  Infection.  Reaction to medicine used during the procedure.  Injury to surrounding organs.  Failure of the procedure.  How effective is sterilization? Sterilization is nearly 100% effective, but it can fail. Also, the fallopian tubes can grow back together over time. If this happens, you will be able to get pregnant again. Women who have had  this procedure have a higher chance of having an ectopic pregnancy. An ectopic pregnancy is a pregnancy that happens outside of the uterus. This kind of pregnancy is unsuccessful and can lead to serious bleeding if it is not treated. What are the benefits?  It is usually effective for a lifetime.  It is usually safe.  It does not have the drawbacks of other types of birth control: That means: ? Your hormones are not affected. Because of this, your menstrual periods, sexual desire, and sexual performance will not be affected. ? There are no side effects. What are the drawbacks?  If you change your mind and decide that you want to have children, you may not be able to. Sterilization may be reversed, but a reversal is not always successful.  It does not provide protection against STDs (sexually transmitted diseases).  It increases the chance of having an ectopic pregnancy. This information is not intended to replace advice given to you by your health care provider. Make sure you discuss any questions you have with your health care provider. Document Released: 02/24/2008 Document Revised: 04/30/2016 Document Reviewed: 06/04/2015 Elsevier Interactive Patient Education  2018 Elsevier Inc.  

## 2018-06-27 ENCOUNTER — Ambulatory Visit: Payer: BLUE CROSS/BLUE SHIELD | Admitting: Obstetrics and Gynecology

## 2018-06-30 ENCOUNTER — Ambulatory Visit: Payer: BLUE CROSS/BLUE SHIELD | Admitting: Certified Nurse Midwife

## 2018-07-03 IMAGING — US US MFM OB DETAIL+14 WK
1 series · 14 of 28 positions shown · non-contrast
Comparison: none

[Series 1: us mfm ob detail+14 wk · 14 of 61 slices shown]
[im 3/61]
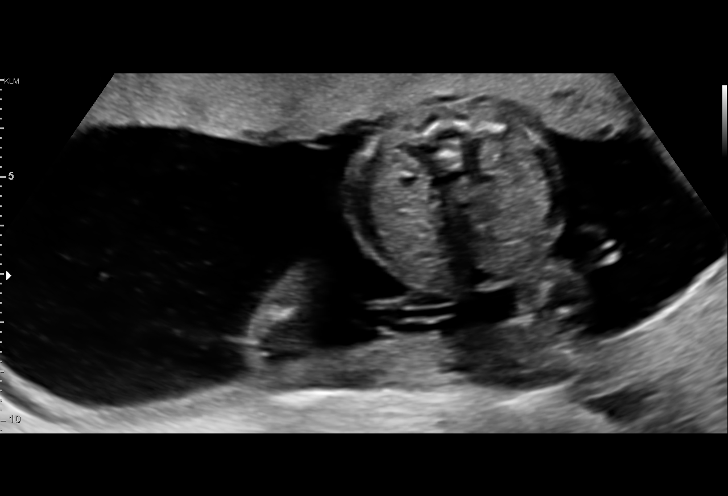
[im 7/61]
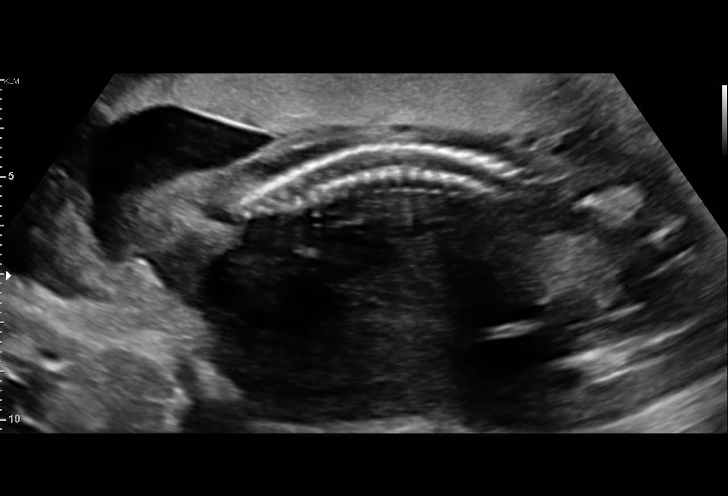
[im 12/61]
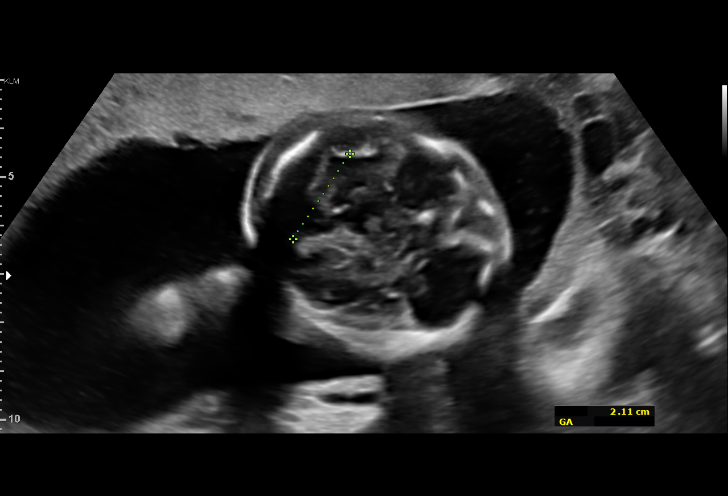
[im 16/61]
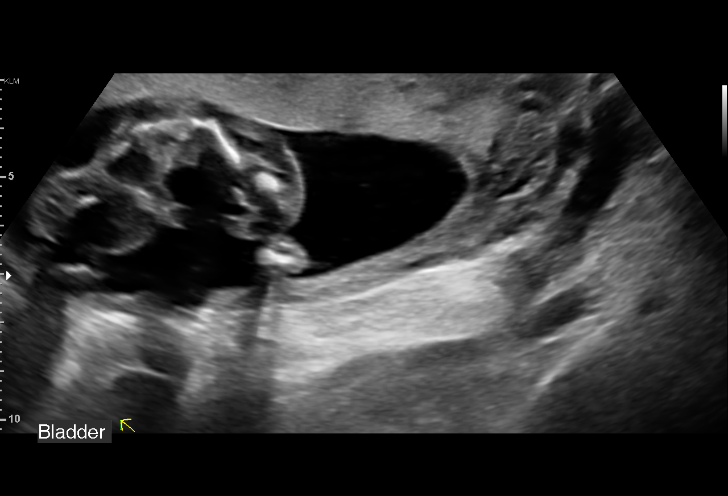
[im 21/61]
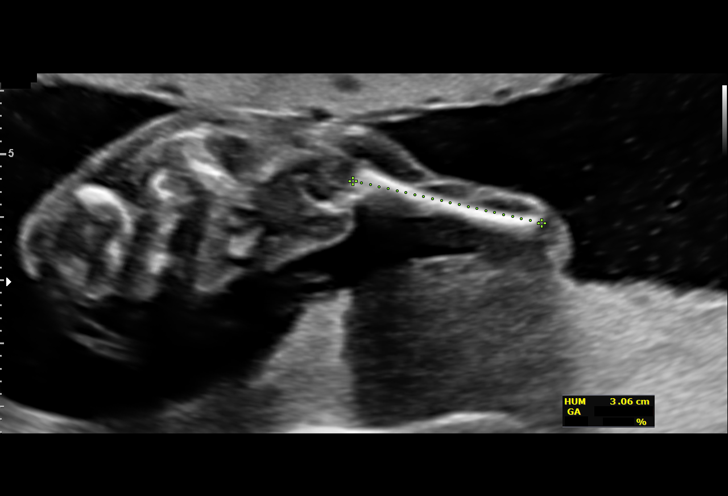
[im 25/61]
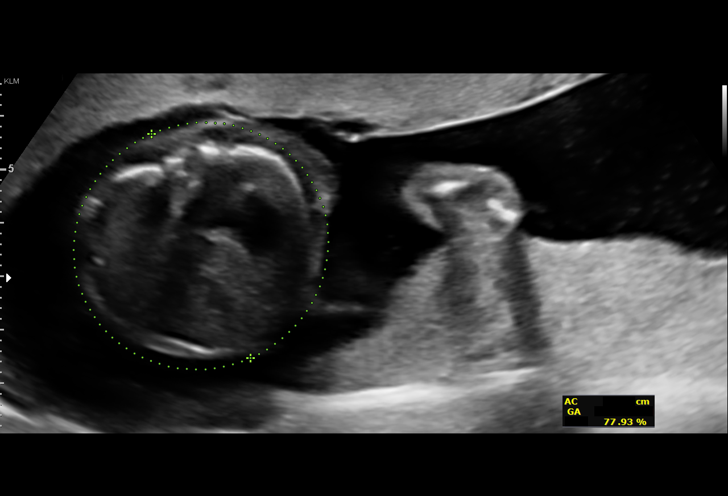
[im 29/61]
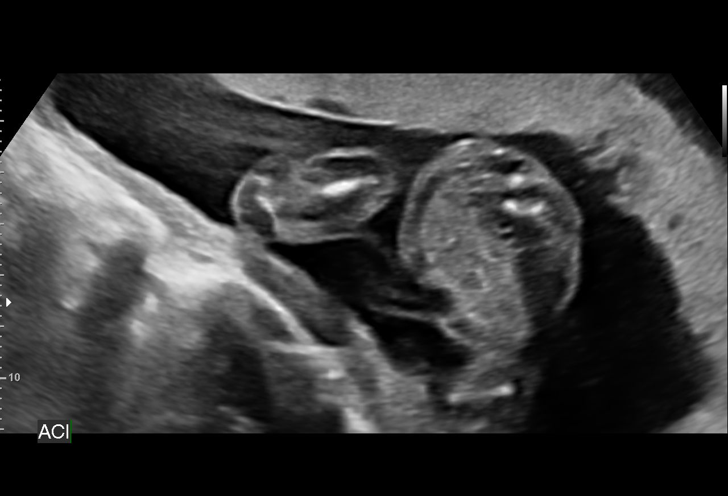
[im 34/61]
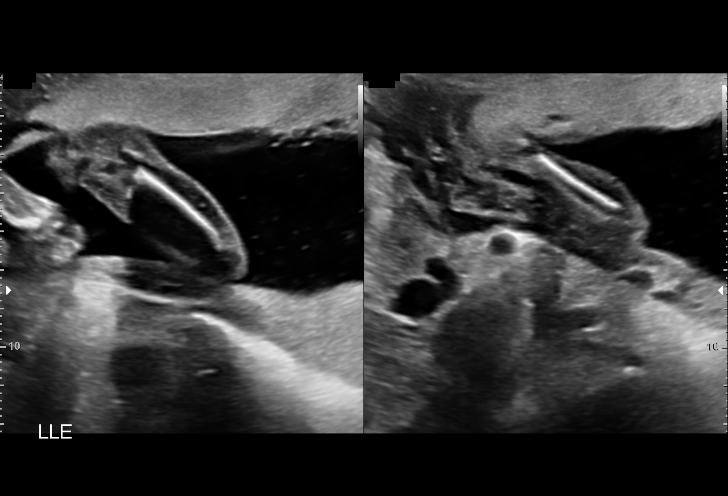
[im 38/61]
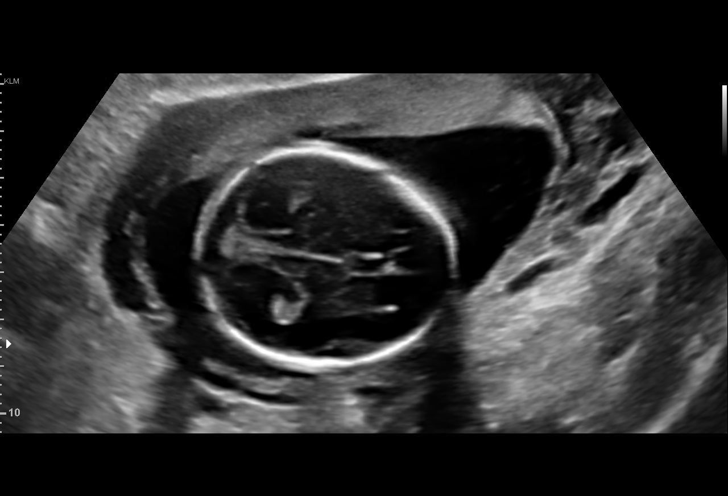
[im 43/61]
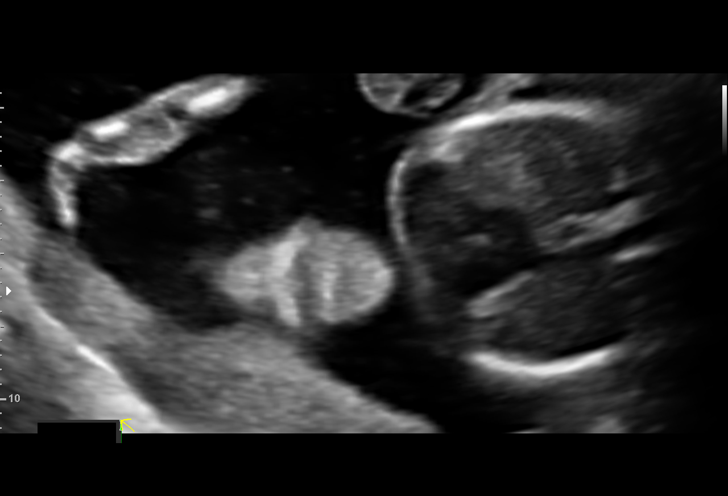
[im 47/61]
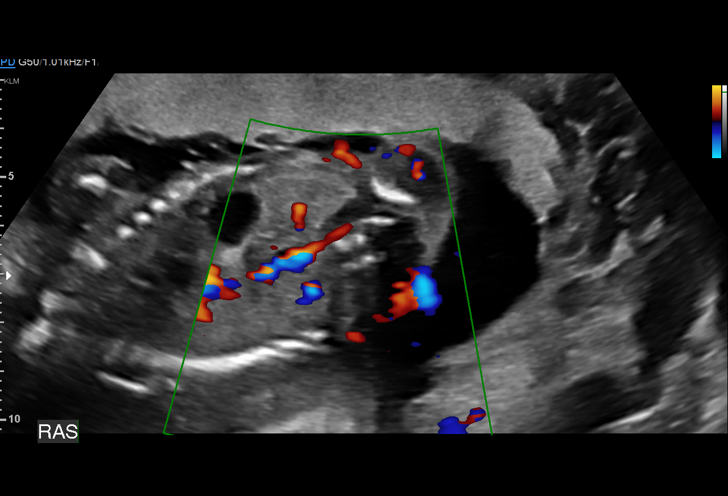
[im 52/61]
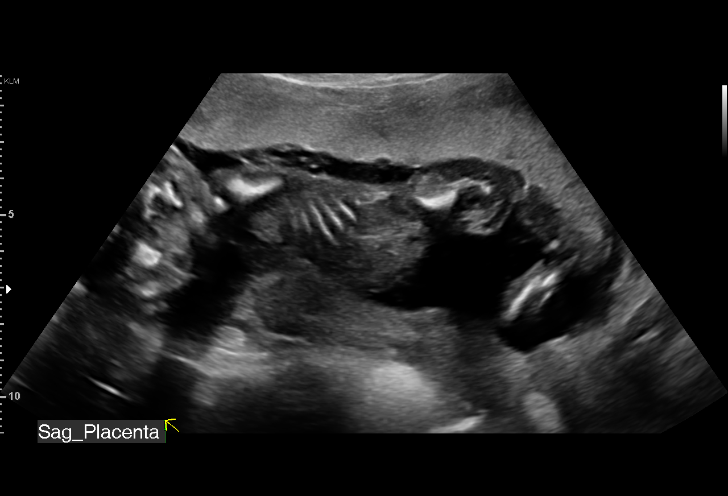
[im 56/61]
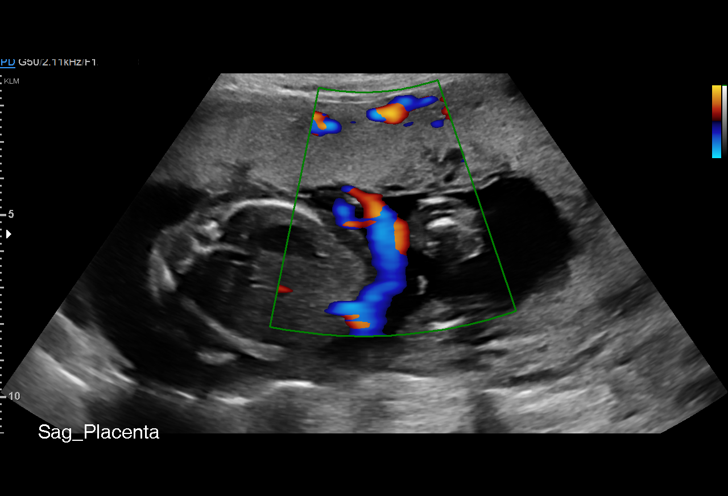
[im 61/61]
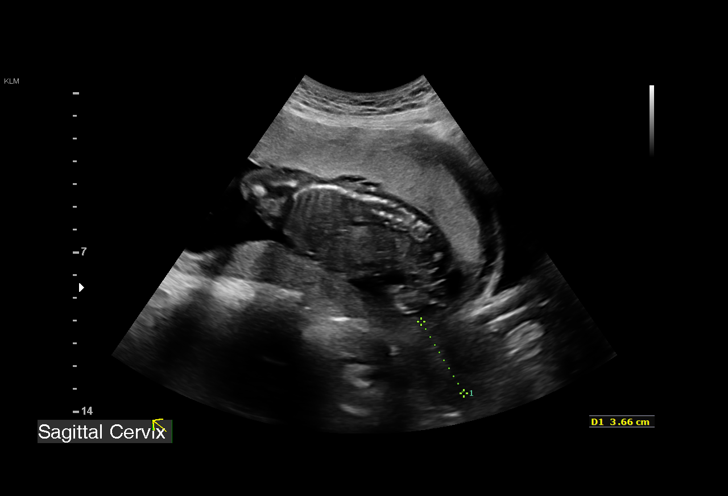

[14 of 28 positions shown; findings below may reference images not displayed]

Road [HOSPITAL]

Indications

19 weeks gestation of pregnancy
Antenatal screening for malformations
Obesity complicating pregnancy, second
trimester (BMI 35)
OB History

Blood Type:            Height:  5'5"   Weight (lb):  213       BMI:
Gravidity:    2         Term:   1        Prem:   0        SAB:   0
TOP:          0       Ectopic:  0        Living: 1
Fetal Evaluation

Num Of Fetuses:     1
Fetal Heart         171
Rate(bpm):
Cardiac Activity:   Observed
Presentation:       Transverse, head to maternal right
Placenta:           Anterior, above cervical os
P. Cord Insertion:  Visualized

Amniotic Fluid
AFI FV:      Subjectively within normal limits
Biometry

BPD:      44.8  mm     G. Age:  19w 4d         74  %    CI:        74.93   %    70 - 86
FL/HC:      18.6   %    16.1 -
HC:      164.2  mm     G. Age:  19w 1d         50  %    HC/AC:      1.11        1.09 -
AC:      147.5  mm     G. Age:  20w 0d         78  %    FL/BPD:     68.3   %
FL:       30.6  mm     G. Age:  19w 3d         60  %    FL/AC:      20.7   %    20 - 24
HUM:      30.9  mm     G. Age:  20w 2d         83  %
CER:      21.1  mm     G. Age:  20w 1d         75  %
Est. FW:     308  gm    0 lb 11 oz      56  %
Gestational Age

U/S Today:     19w 4d                                        EDD:   06/16/18
Best:          19w 0d     Det. By:  D.O. Conception          EDD:   06/20/18
(09/27/17)
Anatomy

Cranium:               Appears normal         Aortic Arch:            Appears normal
Cavum:                 Appears normal         Ductal Arch:            Not well visualized
Ventricles:            Appears normal         Diaphragm:              Appears normal
Choroid Plexus:        Appears normal         Stomach:                Appears normal, left
sided
Cerebellum:            Appears normal         Abdomen:                Appears normal
Posterior Fossa:       Appears normal         Abdominal Wall:         Appears nml (cord
insert, abd wall)
Nuchal Fold:           Appears normal         Cord Vessels:           Appears normal (3
vessel cord)
Face:                  Orbits nl; profile not Kidneys:                Appear normal
well visualized
Lips:                  Appears normal         Bladder:                Appears normal
Thoracic:              Appears normal         Spine:                  Appears normal
Heart:                 Not well visualized    Upper Extremities:      Appears normal
RVOT:                  Not well visualized    Lower Extremities:      Appears normal
LVOT:                  Appears normal

Other:  Fetus appears to be a male. Heels visualized. Open hands visualized.
Cervix Uterus Adnexa

Cervix
Length:            3.6  cm.
Normal appearance by transabdominal scan.
Impression

Singleton intrauterine pregnancy at 19+0 weeks with obesity
here for anatomic survey
Review of the anatomy shows no sonographic markers for
aneuploidy or structural anomalies
However, views of the fetal heart should be considered
suboptimal secondary to maternal body habitus and fetal
position
Amniotic fluid volume is normal
Estimated fetal weight shows growth in the 56th percentile
Recommendations

Recommend follow-up ultrasound examination in 4 weeks for
completion of the anatomic survey

## 2018-09-11 IMAGING — US US MFM FETAL BPP W/O NON-STRESS
1 series · 16 of 28 positions shown · non-contrast
Comparison: none

[Series 1: us mfm fetal bpp w/o non-stress · 32 acquisitions, 16 frames shown]
[im 1/32]
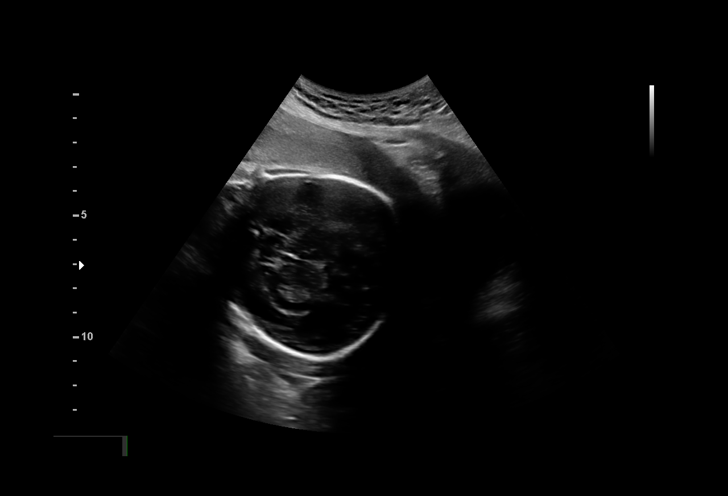
[im 3/32]
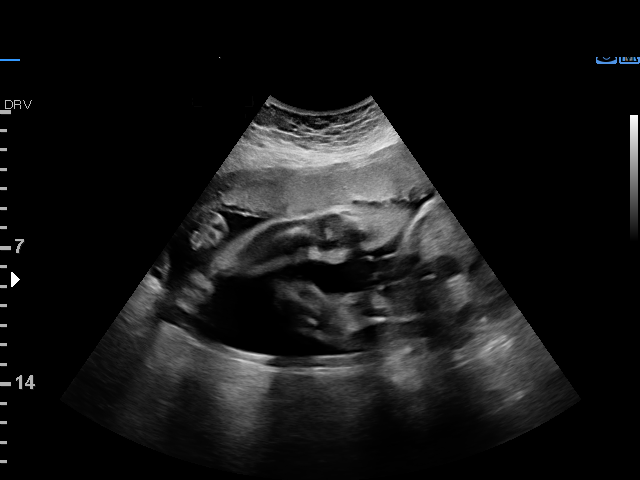
[im 5/32]
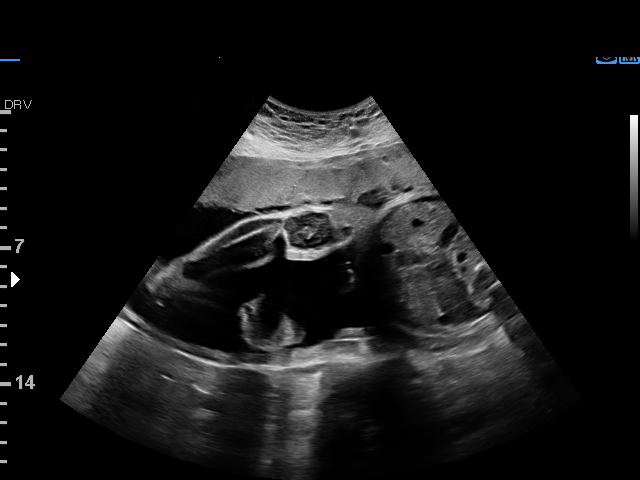
[im 7/32]
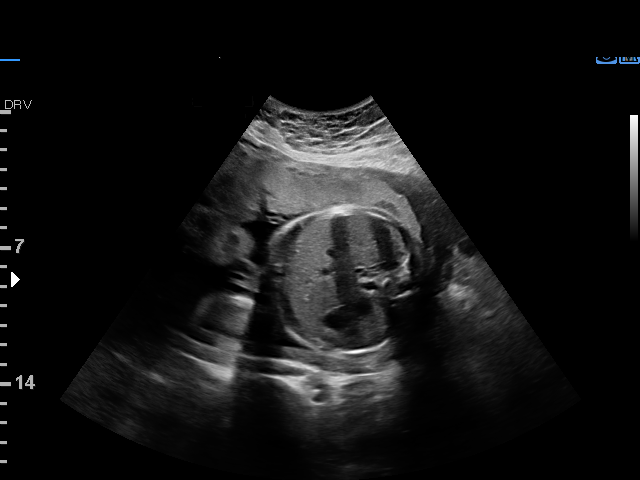
[im 9/32]
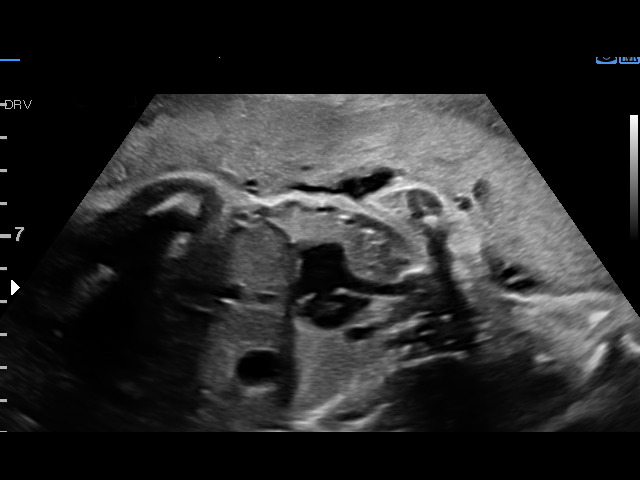
[im 11/32]
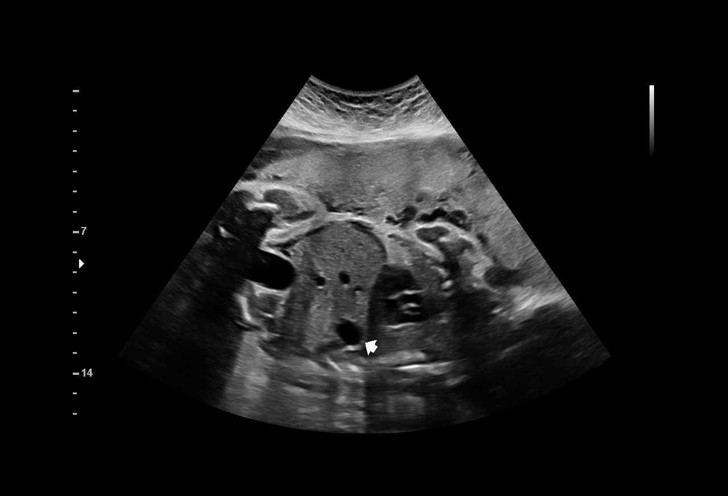
[im 13/32]
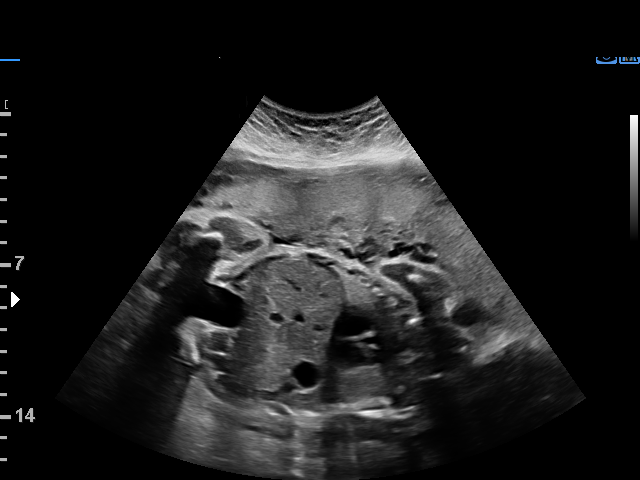
[im 15/32]
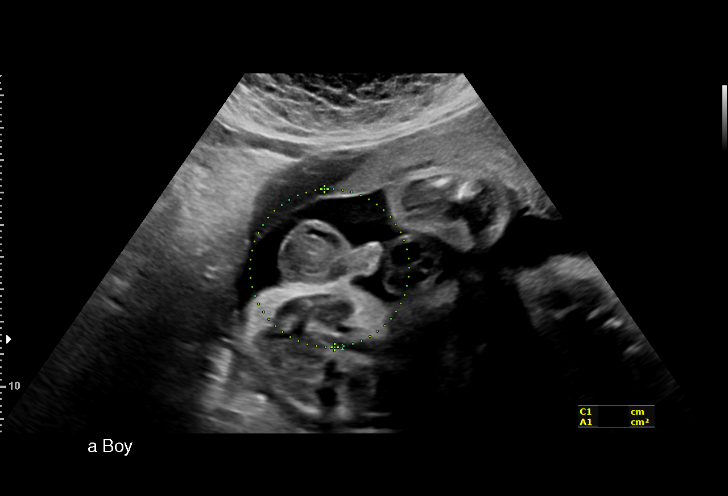
[im 17/32]
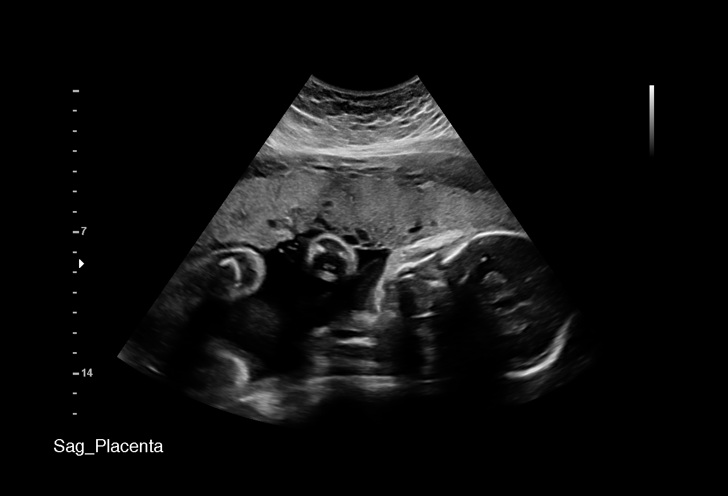
[im 19/32]
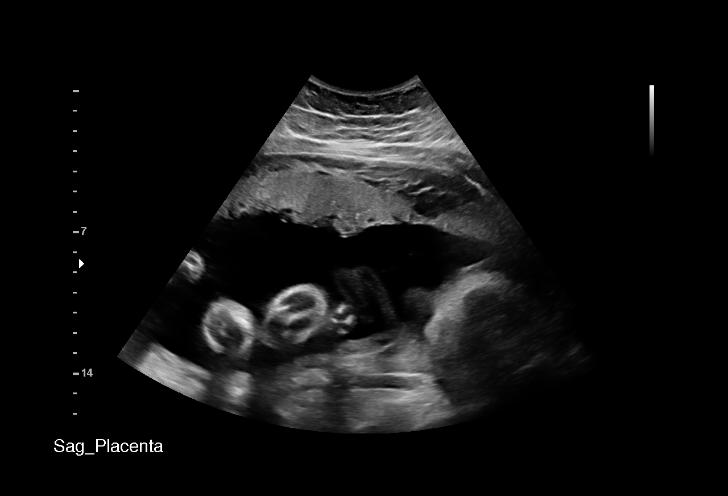
[im 21/32]
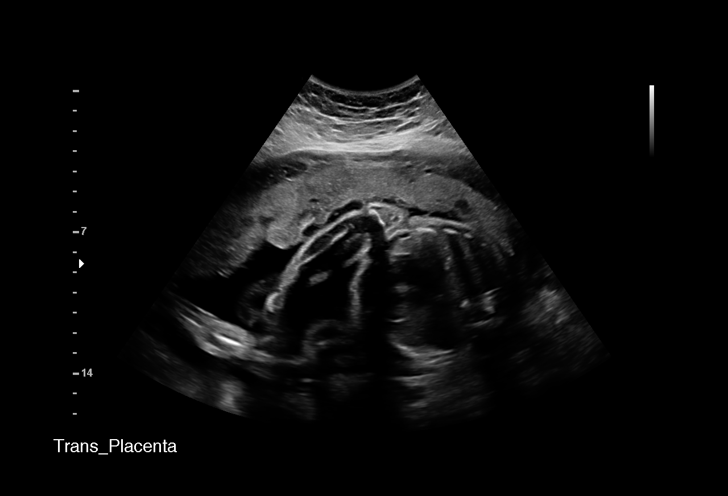
[im 23/32]
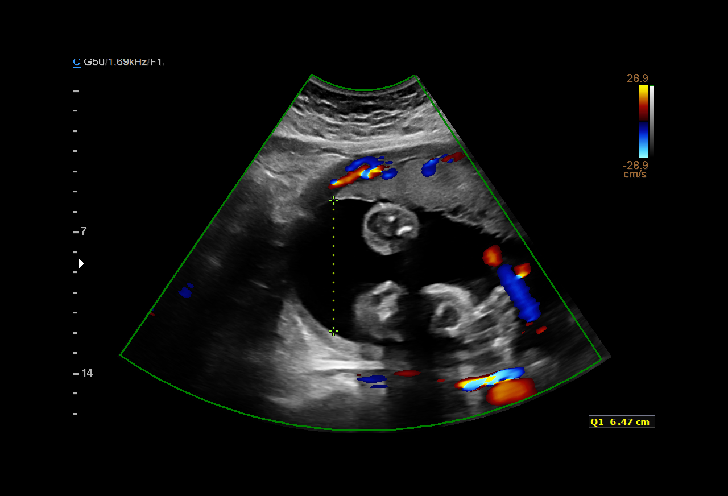
[im 25/32]
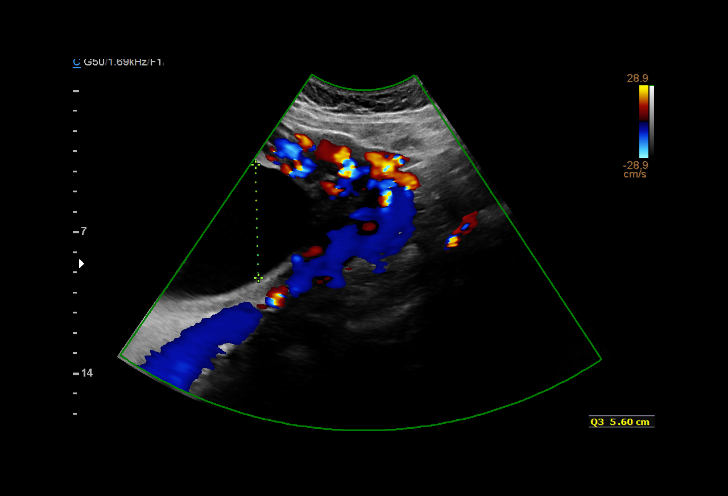
[im 27/32]
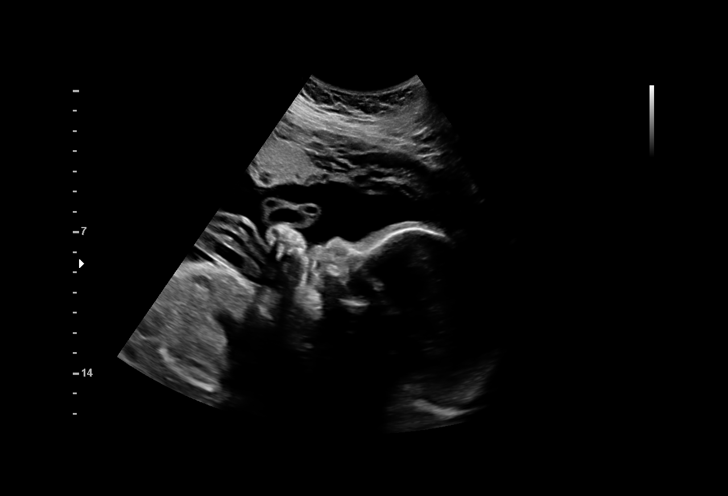
[im 29/32]
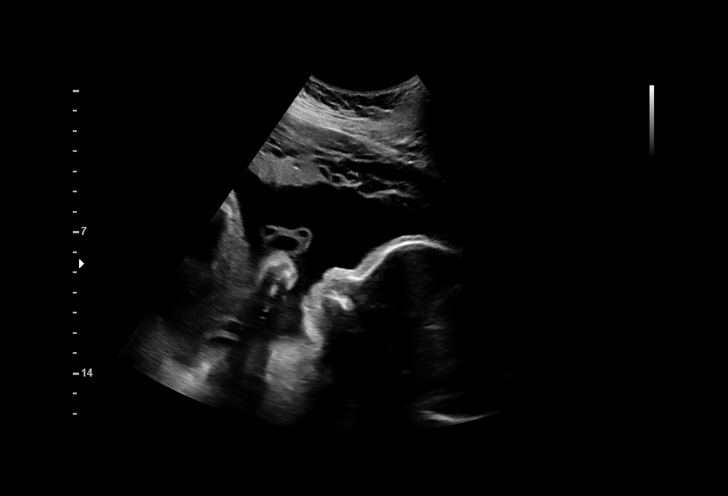
[im 32/32]
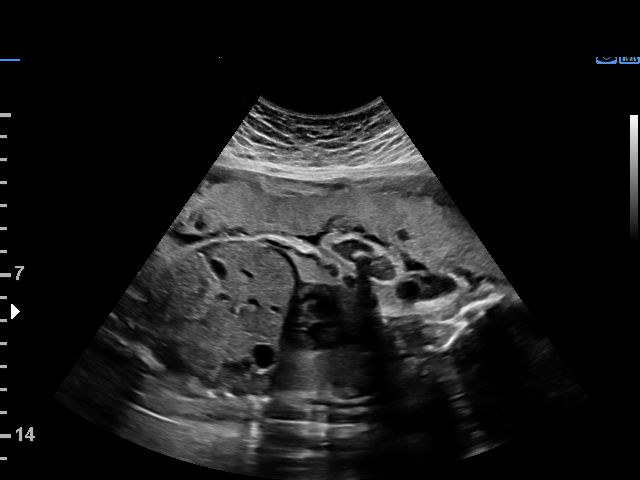

[16 of 28 positions shown; findings below may reference images not displayed]

Road [HOSPITAL]

1  MERRITT NIU           [id number]      [id number]     [id number]
Indications

29 weeks gestation of pregnancy
Obesity complicating pregnancy, third
trimester
Cholestasis of pregnancy, third trimester      [email or web address]
OB History

Blood Type:            Height:  5'5"   Weight (lb):  213       BMI:
Gravidity:    2         Term:   1        Prem:   0        SAB:   0
TOP:          0       Ectopic:  0        Living: 1
Fetal Evaluation

Num Of Fetuses:     1
Fetal Heart         162
Rate(bpm):
Cardiac Activity:   Observed
Presentation:       Cephalic
Placenta:           Anterior

Amniotic Fluid
AFI FV:      Subjectively within normal limits

AFI Sum(cm)     %Tile       Largest Pocket(cm)
17.61           66

RUQ(cm)       RLQ(cm)       LUQ(cm)        LLQ(cm)
6.47
Biophysical Evaluation
Amniotic F.V:   Within normal limits       F. Tone:        Observed
F. Movement:    Observed                   Score:          [DATE]
F. Breathing:   Observed
Gestational Age

Best:          29w 0d     Det. By:  D.O. Conception          EDD:   06/20/18
(09/27/17)
Cervix Uterus Adnexa

Cervix
Not visualized (advanced GA >31wks)
Impression

Cholestasis in pregnancy. Patient is taking ursodiol.
Antenatal testing is reassuring.
BPP [DATE].
Recommendations

Continue weekly antenatal testing till delivery.

## 2019-11-11 ENCOUNTER — Ambulatory Visit: Payer: BLUE CROSS/BLUE SHIELD

## 2020-04-02 ENCOUNTER — Telehealth: Payer: Self-pay | Admitting: Emergency Medicine

## 2020-04-02 DIAGNOSIS — N390 Urinary tract infection, site not specified: Secondary | ICD-10-CM

## 2020-04-02 MED ORDER — FLUCONAZOLE 150 MG PO TABS: ORAL_TABLET | ORAL | 0 refills | Status: DC

## 2020-04-02 MED ORDER — CEPHALEXIN 500 MG PO CAPS: 500.0000 mg | ORAL_CAPSULE | Freq: Two times a day (BID) | ORAL | 0 refills | Status: DC

## 2020-04-02 MED ORDER — CEPHALEXIN 500 MG PO CAPS: 500.0000 mg | ORAL_CAPSULE | Freq: Two times a day (BID) | ORAL | 0 refills | Status: AC

## 2020-04-02 MED FILL — CEPHALEXIN 500 MG CAPSULE: 500 | 7 days supply | Qty: 14 | Fill #0

## 2020-04-02 MED FILL — FLUCONAZOLE 150 MG TABS: 150 | 1 days supply | Qty: 1 | Fill #0

## 2020-04-02 NOTE — Addendum Note (Signed)
Addended by: Graciella Freer A on: 04/02/2020 12:02 PM   Modules accepted: Orders

## 2020-04-02 NOTE — Progress Notes (Signed)
We are sorry that you are not feeling well.  Here is how we plan to help!  Based on what you shared with me it looks like you most likely have a simple urinary tract infection.  A UTI (Urinary Tract Infection) is a bacterial infection of the bladder.  Most cases of urinary tract infections are simple to treat but a key part of your care is to encourage you to drink plenty of fluids and watch your symptoms carefully.  I have prescribed Keflex 500 mg twice a day for 7 days. I have also provided Diflucan for yeast infection.  Your symptoms should gradually improve. Call us if the burning in your urine worsens, you develop worsening fever, back pain or pelvic pain or if your symptoms do not resolve after completing the antibiotic.  Urinary tract infections can be prevented by drinking plenty of water to keep your body hydrated.  Also be sure when you wipe, wipe from front to back and don't hold it in!  If possible, empty your bladder every 4 hours.  Your e-visit answers were reviewed by a board certified advanced clinical practitioner to complete your personal care plan.  Depending on the condition, your plan could have included both over the counter or prescription medications.  If there is a problem please reply  once you have received a response from your provider.  Your safety is important to [place].  If you have drug allergies check your prescription carefully.    You can use MyChart to ask questions about today's visit, request a non-urgent call back, or ask for a work or school excuse for 24 hours related to this e-Visit. If it has been greater than 24 hours you will need to follow up with your provider, or enter a new e-Visit to address those concerns.   You will get an e-mail in the next two days asking about your experience.  I hope that your e-visit has been valuable and will speed your recovery. Thank you for using e-visits.  I spent 5-10 minutes reviewing patient's medical record and  chart.

## 2020-06-13 ENCOUNTER — Other Ambulatory Visit (HOSPITAL_COMMUNITY)
Admission: RE | Admit: 2020-06-13 | Discharge: 2020-06-13 | Disposition: A | Discharge status: Home / Self Care | Payer: No Typology Code available for payment source | Source: Ambulatory Visit | Attending: Nurse Practitioner | Admitting: Nurse Practitioner

## 2020-06-13 ENCOUNTER — Encounter: Payer: Self-pay | Admitting: Nurse Practitioner

## 2020-06-13 ENCOUNTER — Other Ambulatory Visit: Payer: Self-pay

## 2020-06-13 ENCOUNTER — Ambulatory Visit (INDEPENDENT_AMBULATORY_CARE_PROVIDER_SITE_OTHER): Payer: No Typology Code available for payment source | Admitting: Nurse Practitioner

## 2020-06-13 VITALS — BP 106/69 | HR 72 | Ht 65.0 in | Wt 187.0 lb

## 2020-06-13 DIAGNOSIS — Z01419 Encounter for gynecological examination (general) (routine) without abnormal findings: Secondary | ICD-10-CM | POA: Diagnosis not present

## 2020-06-13 DIAGNOSIS — Z6831 Body mass index (BMI) 31.0-31.9, adult: Secondary | ICD-10-CM | POA: Diagnosis not present

## 2020-06-13 NOTE — Progress Notes (Signed)
GYNECOLOGY ANNUAL PREVENTATIVE CARE ENCOUNTER NOTE  Subjective:   Rachel Campos is a 24 y.o. [id number] female here for a routine annual gynecologic exam.  Current complaints: wants STD testing.   Denies abnormal vaginal bleeding, discharge, pelvic pain, problems with intercourse or other gynecologic concerns.    Gynecologic History Patient's last menstrual period was 05/30/2020 (exact date). Contraception: tubal ligation  Now has menses heavy for 5 days Last Pap: 2019. Results were: normal   Obstetric History OB History  Gravida Para Term Preterm AB Living  2 2 2     2   SAB TAB Ectopic Multiple Live Births        0 2    # Outcome Date GA Lbr Len/2nd Weight Sex Delivery Anes PTL Lv  2 Term 05/30/18 [redacted]w[redacted]d 06:04 / 00:12 7 lb 5.5 oz (3.331 kg) M Vag-Spont EPI  LIV  1 Term 05/10/16 [redacted]w[redacted]d 05:58 / 01:11 8 lb 2.5 oz (3.7 kg) M Vag-Spont EPI  LIV    Past Medical History:  Diagnosis Date  . Anxiety   . Depression   . Headache   . Thoracic spine pain 06/26/2016    Past Surgical History:  Procedure Laterality Date  . NO PAST SURGERIES    . TUBAL LIGATION N/A 05/31/2018   Procedure: POST PARTUM TUBAL LIGATION;  Surgeon: 07/31/2018, DO;  [facility];  Service: Gynecology;  Laterality: N/A;    No current outpatient medications on file prior to visit.   No current facility-administered medications on file prior to visit.    No Known Allergies  Social History   Socioeconomic History  . Marital status: Single    Spouse name: Not on file  . Number of children: Not on file  . Years of education: Not on file  . Highest education level: Not on file  Occupational History  . Not on file  Tobacco Use  . Smoking status: Former [employer]  . Smokeless tobacco: Never Used  Substance and Sexual Activity  . Alcohol use: Yes  . Drug use: No  . Sexual activity: Not Currently    Partners: Male    Birth control/protection: Surgical  Other Topics Concern  . Not on file   Social History Narrative  . Not on file   Social Determinants of Health   Financial Resource Strain:   . Difficulty of Paying Living Expenses: Not on file  Food Insecurity:   . Worried About Games developer in the Last Year: Not on file  . Ran Out of Food in the Last Year: Not on file  Transportation Needs:   . Lack of Transportation (Medical): Not on file  . Lack of Transportation (Non-Medical): Not on file  Physical Activity:   . Days of Exercise per Week: Not on file  . Minutes of Exercise per Session: Not on file  Stress:   . Feeling of Stress : Not on file  Social Connections:   . Frequency of Communication with Friends and Family: Not on file  . Frequency of Social Gatherings with Friends and Family: Not on file  . Attends Religious Services: Not on file  . Active Member of Clubs or Organizations: Not on file  . Attends Programme researcher, broadcasting/film/video Meetings: Not on file  . Marital Status: Not on file  Intimate Partner Violence:   . Fear of Current or Ex-Partner: Not on file  . Emotionally Abused: Not on file  . Physically Abused: Not on file  . Sexually  Abused: Not on file    Family History  Adopted: Yes  Family history unknown: Yes    The following portions of the patient's history were reviewed and updated as appropriate: allergies, current medications, past family history, past medical history, past social history, past surgical history and problem list.  Review of Systems Pertinent items noted in HPI and remainder of comprehensive ROS otherwise negative.   Objective:  BP 106/69   Pulse 72   Ht 5\' 5"  (1.651 m)   Wt 187 lb (84.8 kg)   LMP 05/30/2020 (Exact Date) Comment: Tubal  BMI 31.12 kg/m  CONSTITUTIONAL: Well-developed, well-nourished female in no acute distress.  HENT:  Normocephalic, atraumatic, External right and left ear normal.  EYES: Conjunctivae and EOM are normal. Pupils are equal, round.  No scleral icterus.  NECK: Normal range of motion,  supple, no masses.  Normal thyroid.  SKIN: Skin is warm and dry. No rash noted. Not diaphoretic. No erythema. No pallor. NEUROLOGIC: Alert and oriented to person, place, and time. Normal reflexes, muscle tone coordination. No cranial nerve deficit noted. PSYCHIATRIC: Normal mood and affect. Normal behavior. Normal judgment and thought content. CARDIOVASCULAR: Normal heart rate noted, regular rhythm RESPIRATORY: Clear to auscultation bilaterally. Effort and breath sounds normal, no problems with respiration noted. BREASTS: Symmetric in size. No masses, skin changes, nipple drainage, or lymphadenopathy. ABDOMEN: Soft, no distention noted.  No tenderness, rebound or guarding.  PELVIC: Normal appearing external genitalia; normal appearing vaginal mucosa and cervix.  No abnormal discharge noted.  Pap smear obtained.  Normal uterine size, no other palpable masses, no uterine or adnexal tenderness. MUSCULOSKELETAL: Normal range of motion. No tenderness.  No cyanosis, clubbing, or edema.    Assessment and Plan:  1. 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 BMI 31.0-31.9,adult Exercises and works out several times a week   Routine preventative health maintenance measures emphasized. Please refer to After Visit Summary for other counseling recommendations.    06-06-1970, RN, MSN, NP-BC Nurse Practitioner, [facility] for [facility]

## 2020-06-13 NOTE — Progress Notes (Addendum)
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 Last PAP 11/22/2017.  C/o heavy periods with blood clots.  She will get the FLU Vaccine at work.  PHQ-9=0

## 2020-06-14 LAB — CERVICOVAGINAL ANCILLARY ONLY
Bacterial Vaginitis (gardnerella): POSITIVE — AB
Candida Glabrata: NEGATIVE
Candida Vaginitis: NEGATIVE
Chlamydia: NEGATIVE
Comment: NEGATIVE
Comment: NEGATIVE
Comment: NEGATIVE
Comment: NEGATIVE
Comment: NEGATIVE
Comment: NORMAL
Neisseria Gonorrhea: NEGATIVE
Trichomonas: NEGATIVE

## 2020-06-14 LAB — RPR: RPR Ser Ql: NONREACTIVE

## 2020-06-14 LAB — HEPATITIS C ANTIBODY: Hep C Virus Ab: <0.1 s/co ratio (ref 0.0–0.9)

## 2020-06-14 LAB — HEPATITIS B SURFACE ANTIGEN: Hepatitis B Surface Ag: NEGATIVE

## 2020-06-14 LAB — HIV ANTIBODY (ROUTINE TESTING W REFLEX): HIV Screen 4th Generation wRfx: NONREACTIVE

## 2020-06-18 MED ORDER — METRONIDAZOLE 500 MG PO TABS: 500.0000 mg | ORAL_TABLET | Freq: Two times a day (BID) | ORAL | 0 refills | Status: AC

## 2020-06-18 NOTE — Addendum Note (Signed)
Addended by: Currie Paris on: 06/18/2020 08:24 AM   Modules accepted: Orders

## 2020-07-03 ENCOUNTER — Telehealth: Payer: No Typology Code available for payment source | Admitting: Family

## 2020-07-03 ENCOUNTER — Other Ambulatory Visit: Payer: Self-pay | Admitting: Family

## 2020-07-03 DIAGNOSIS — R399 Unspecified symptoms and signs involving the genitourinary system: Secondary | ICD-10-CM | POA: Diagnosis not present

## 2020-07-03 MED ORDER — CEPHALEXIN 500 MG PO CAPS: 500.0000 mg | ORAL_CAPSULE | Freq: Two times a day (BID) | ORAL | 0 refills | Status: DC

## 2020-07-03 MED FILL — CEPHALEXIN 500 MG CAPSULE: 500 | 7 days supply | Qty: 14 | Fill #0

## 2020-07-03 NOTE — Progress Notes (Signed)
Ok, since your back pain is not related to your new urinary problems, please see below our plan.   We are sorry that you are not feeling well.  Here is how we plan to help!  Based on what you shared with me it looks like you most likely have a simple urinary tract infection.  A UTI (Urinary Tract Infection) is a bacterial infection of the bladder.  Most cases of urinary tract infections are simple to treat but a key part of your care is to encourage you to drink plenty of fluids and watch your symptoms carefully.  I have prescribed Keflex 500 mg twice a day for 7 days.  Your symptoms should gradually improve. Call us if the burning in your urine worsens, you develop worsening fever, back pain or pelvic pain or if your symptoms do not resolve after completing the antibiotic.  Urinary tract infections can be prevented by drinking plenty of water to keep your body hydrated.  Also be sure when you wipe, wipe from front to back and don't hold it in!  If possible, empty your bladder every 4 hours.  Your e-visit answers were reviewed by a board certified advanced clinical practitioner to complete your personal care plan.  Depending on the condition, your plan could have included both over the counter or prescription medications.  If there is a problem please reply  once you have received a response from your provider.  Your safety is important to [place].  If you have drug allergies check your prescription carefully.    You can use MyChart to ask questions about todays visit, request a non-urgent call back, or ask for a work or school excuse for 24 hours related to this e-Visit. If it has been greater than 24 hours you will need to follow up with your provider, or enter a new e-Visit to address those concerns.   You will get an e-mail in the next two days asking about your experience.  I hope that your e-visit has been valuable and will speed your recovery. Thank you for using e-visits.

## 2020-07-03 NOTE — Progress Notes (Signed)
Based on what you shared with me, I feel your condition warrants further evaluation and I recommend that you be seen for a face to face office visit.   Given you are having UTI symptoms with back pain, you need to be seen face to face to rule out a more serious infection.    NOTE: If you entered your credit card information for this eVisit, you will not be charged. You may see a "hold" on your card for the $35 but that hold will drop off and you will not have a charge processed.   If you are having a true medical emergency please call 911.      For an urgent face to face visit, [facility] has five urgent care centers for your convenience:     [facility] [place] Get Driving Directions [phone] [address] [place], [place] [address] . 10 am - 6pm Monday - Friday    [facility] ([place]) Get Driving Directions [phone] [address] [place], [place] [address] . 10 am to 8 pm Monday-Friday . 12 pm to 8 pm Saturday-Sunday     [facility] Get Driving Directions [phone] [address], [address] [place], [place] [address] . 8 am to 8 pm Monday-Friday . 9 am to 6 pm Saturday . 11 am to 6 pm Sunday     [facility] [place] Get Driving Directions  [phone] [address] [place], [place] [address] . 8 am to 8 pm Monday-Friday . 8 am to 4 pm Saturday-Sunday   [facility] [place] Get Driving Directions [phone] [address] , [place] [address] . 12 pm to 6 pm Monday-Friday      Your e-visit answers were reviewed by a board certified advanced clinical practitioner to complete your personal care plan.  Thank you for using e-Visits.     

## 2020-07-03 NOTE — Addendum Note (Signed)
Addended by: Jannifer Rodney A on: 07/03/2020 08:48 AM   Modules accepted: Orders

## 2020-07-05 ENCOUNTER — Other Ambulatory Visit: Payer: Self-pay | Admitting: Family

## 2020-07-05 NOTE — Progress Notes (Signed)
Approximately 5 minutes was spent documenting and reviewing patient's chart.   

## 2021-04-22 ENCOUNTER — Other Ambulatory Visit: Payer: Self-pay | Admitting: Family

## 2021-04-30 ENCOUNTER — Other Ambulatory Visit: Payer: Self-pay

## 2021-06-03 ENCOUNTER — Other Ambulatory Visit: Payer: Self-pay

## 2021-06-03 ENCOUNTER — Encounter: Payer: Self-pay | Admitting: Nurse Practitioner

## 2021-06-03 ENCOUNTER — Other Ambulatory Visit (HOSPITAL_COMMUNITY)
Admission: RE | Admit: 2021-06-03 | Discharge: 2021-06-03 | Disposition: A | Discharge status: Home / Self Care | Payer: Self-pay | Source: Ambulatory Visit | Attending: Obstetrics and Gynecology | Admitting: Obstetrics and Gynecology

## 2021-06-03 ENCOUNTER — Ambulatory Visit (INDEPENDENT_AMBULATORY_CARE_PROVIDER_SITE_OTHER): Payer: Self-pay | Admitting: Nurse Practitioner

## 2021-06-03 VITALS — BP 124/82 | HR 73 | Ht 65.0 in | Wt 193.0 lb

## 2021-06-03 DIAGNOSIS — R350 Frequency of micturition: Secondary | ICD-10-CM

## 2021-06-03 DIAGNOSIS — N3001 Acute cystitis with hematuria: Secondary | ICD-10-CM

## 2021-06-03 DIAGNOSIS — Z124 Encounter for screening for malignant neoplasm of cervix: Secondary | ICD-10-CM | POA: Insufficient documentation

## 2021-06-03 LAB — POCT URINALYSIS DIPSTICK
Bilirubin, UA: NEGATIVE
Glucose, UA: NEGATIVE
Ketones, UA: NEGATIVE
Nitrite, UA: NEGATIVE
Protein, UA: NEGATIVE
Spec Grav, UA: 1.01 (ref 1.010–1.025)
Urobilinogen, UA: 0.2 E.U./dL
pH, UA: 6 (ref 5.0–8.0)

## 2021-06-03 NOTE — Progress Notes (Signed)
Pt states she has had issues with UTI's since her tubal in 2019.   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  Pt states her cycles have changed as well, having clots.   Pt declines STD testing today, pt would like Vit D and liver/kidney function.

## 2021-06-03 NOTE — Progress Notes (Signed)
GYNECOLOGY OFFICE VISIT NOTE   History:  24 y.o. [id number] here today for pap and urinary frequency.  A friend was recently diagnosed with cervical cancer and she was aware her own Pap was overdue.  She came for an annual physical but did not know her insurance had lapsed.  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  Sometimes she does not get medicine but recently did a video visit through [facility] and was given antibiotics for a UTI but she had not had a urinalysis - she was going based on symptoms she had of urinary frequency when she had a UTI when she was pregnant.  She denies any abnormal vaginal discharge, bleeding, pelvic pain or other concerns.   Reviewed other items that might help her not have vaginal problems - does not douche, advised no underwear at night that is against her skin - advised loose clothing at night.  Advised changing wash cloth daily as she is having this problem of urinary frequency and UTI for about 6 weeks.  Describes having very sensitive skin and wonders if she is allergic to her partner.  UpToDate reviewed and there are reports investigated of females becoming allergic to seminal plasma from a female partner - using condoms prevents this allergy reaction as the semen does not come in contact with the vagina.  Past Medical History:  Diagnosis Date   Anxiety    Depression    Headache    Thoracic spine pain 06/26/2016    Past Surgical History:  Procedure Laterality Date   NO PAST SURGERIES     TUBAL LIGATION N/A 05/31/2018   Procedure: POST PARTUM TUBAL LIGATION;  Surgeon: Levie Heritage, DO;  [facility];  Service: Gynecology;  Laterality: N/A;    The following portions of the patient's history were reviewed and updated as appropriate: allergies, current medications, past family history, past medical  history, past social history, past surgical history and problem list.   Health Maintenance:  Normal pap on 11-22-17.    Review of Systems:  Pertinent items noted in HPI and remainder of comprehensive ROS otherwise negative.  Objective:  Physical Exam BP 124/82   Pulse 73   Ht 5\' 5"  (1.651 m)   Wt 193 lb (87.5 kg)   LMP 05/05/2021 (Approximate)   BMI 32.12 kg/m  CONSTITUTIONAL: Well-developed, well-nourished female in no acute distress.  HENT:  Normocephalic, atraumatic. External right and left ear normal.  EYES: Conjunctivae and EOM are normal. Pupils are equal, round.  No scleral icterus.  NECK: Normal range of motion, supple, no masses SKIN: Skin is warm and dry. No rash noted. Not diaphoretic. No erythema. No pallor. NEUROLOGIC: Alert and oriented to person, place, and time. Normal muscle tone coordination. No cranial nerve deficit noted. PSYCHIATRIC: Normal mood and affect. Normal behavior. Normal judgment and thought content. CARDIOVASCULAR: Normal heart rate noted RESPIRATORY: Effort and breath sounds normal, no problems with respiration noted ABDOMEN: Soft, no distention noted.  Nontender PELVIC: Normal appearing external genitalia; normal appearing vaginal mucosa and cervix.  No abnormal discharge noted.  Normal uterine size, no other palpable masses, no uterine or adnexal tenderness. Pap done MUSCULOSKELETAL: Normal range of motion. No edema noted.  Labs and Imaging No results found.  Assessment & Plan:  1. Urinary frequency Will  do urine culture to determine for sure if there is a UIT Patient will work to get insurance reestablished - is working lots in [place] for [employer]. Did not do any additional labs today for other problems Advised to continue to take Vit D3 as a daily supplement.  - POCT urinalysis dipstick - Urine Culture  2. Pap smear for cervical cancer screening Done today  - Cytology - PAP( [facility])   Routine preventative  health maintenance measures emphasized. Please refer to After Visit Summary for other counseling recommendations.   Return in about 1 year (around 06/03/2022).   Total face-to-face time with patient:  10  minutes.  Over 50% of encounter was spent on counseling and coordination of care.  Nolene Bernheim, RN, MSN, NP-BC Nurse Practitioner, [facility] for [employer], [facility] 06/03/2021 2:58 PM

## 2021-06-04 LAB — CERVICOVAGINAL ANCILLARY ONLY
Bacterial Vaginitis (gardnerella): NEGATIVE
Candida Glabrata: NEGATIVE
Candida Vaginitis: NEGATIVE
Chlamydia: NEGATIVE
Comment: NEGATIVE
Comment: NEGATIVE
Comment: NEGATIVE
Comment: NEGATIVE
Comment: NEGATIVE
Comment: NORMAL
Neisseria Gonorrhea: NEGATIVE
Trichomonas: NEGATIVE

## 2021-06-09 LAB — CYTOLOGY - PAP: Diagnosis: NEGATIVE

## 2021-06-10 LAB — URINE CULTURE

## 2021-06-10 MED ORDER — NITROFURANTOIN MONOHYD MACRO 100 MG PO CAPS: 100.0000 mg | ORAL_CAPSULE | Freq: Two times a day (BID) | ORAL | 0 refills | Status: AC

## 2021-06-10 NOTE — Addendum Note (Signed)
Addended by: Currie Paris on: 06/10/2021 09:31 AM   Modules accepted: Orders

## 2021-06-16 ENCOUNTER — Ambulatory Visit: Payer: Self-pay | Admitting: Obstetrics and Gynecology

## 2023-08-23 ENCOUNTER — Other Ambulatory Visit (HOSPITAL_COMMUNITY)
Admission: RE | Admit: 2023-08-23 | Discharge: 2023-08-23 | Disposition: A | Discharge status: Home / Self Care | Payer: Self-pay | Source: Ambulatory Visit | Attending: Obstetrics & Gynecology | Admitting: Obstetrics & Gynecology

## 2023-08-23 ENCOUNTER — Ambulatory Visit (INDEPENDENT_AMBULATORY_CARE_PROVIDER_SITE_OTHER): Payer: Self-pay | Admitting: Obstetrics & Gynecology

## 2023-08-23 ENCOUNTER — Encounter: Payer: Self-pay | Admitting: Obstetrics & Gynecology

## 2023-08-23 VITALS — BP 109/73 | HR 80 | Ht 65.0 in | Wt 186.0 lb

## 2023-08-23 DIAGNOSIS — N941 Unspecified dyspareunia: Secondary | ICD-10-CM

## 2023-08-23 NOTE — Progress Notes (Addendum)
27 y.o. GYN presents for painful intercourse 8/-9/10 x 6 months, bleeding during sex x 2 months.  Pt denies discharge, odor or urinary issues.

## 2023-08-23 NOTE — Progress Notes (Unsigned)
Patient ID: Rachel Campos, female   DOB: 06-02-1996, 27 y.o.   MRN: [id number]  Chief Complaint  Patient presents with   Vaginal Pain    HPI Dyan Belluomini is a 27 y.o. female.  [id number] Patient's last menstrual period was 07/28/2023 (exact date). S/P BTL 2019 postpartum with partial salpingectomy. She has has dyspareunia for about 4 months with postcoital bleeding as well. O/W no pain or DUB, no d/c or vaginal irritation. Same partner for 4 years. HPI  Past Medical History:  Diagnosis Date   Anxiety    Depression    Headache    Thoracic spine pain 06/26/2016    Past Surgical History:  Procedure Laterality Date   NO PAST SURGERIES     TUBAL LIGATION N/A 05/31/2018   Procedure: POST PARTUM TUBAL LIGATION;  Surgeon: Levie Heritage, DO;  [facility];  Service: Gynecology;  Laterality: N/A;    Family History  Adopted: Yes  Family history unknown: Yes    Social History Social History   Tobacco Use   Smoking status: Former   Smokeless tobacco: Never  Substance Use Topics   Alcohol use: Not Currently   Drug use: No    No Known Allergies  Current Outpatient Medications  Medication Sig Dispense Refill   nitrofurantoin, macrocrystal-monohydrate, (MACROBID) 100 MG capsule Take 1 capsule (100 mg total) by mouth 2 (two) times daily. 10 capsule 0   No current facility-administered medications for this visit.    Review of Systems Review of Systems  Constitutional: Negative.   Respiratory: Negative.    Cardiovascular: Negative.   Gastrointestinal: Negative.   Genitourinary:  Positive for dyspareunia and pelvic pain. Negative for vaginal bleeding, vaginal discharge and vaginal pain.    Blood pressure 109/73, pulse 80, height 5\' 5"  (1.651 m), weight 186 lb (84.4 kg), last menstrual period 07/28/2023.  Physical Exam Physical Exam Exam conducted with a chaperone present.  Constitutional:      Appearance: Normal appearance.  Genitourinary:    General: Normal  vulva.     Exam position: Lithotomy position.     Vagina: Vaginal discharge present.     Cervix: Normal.     Uterus: Tender (mild).      Adnexa: Right adnexa normal and left adnexa normal.  Skin:    General: Skin is warm and dry.  Neurological:     General: No focal deficit present.     Mental Status: She is alert.  Psychiatric:        Mood and Affect: Mood normal.        Behavior: Behavior normal.     Data Reviewed Pap nl 2022  Assessment Dyspareunia in female - Plan: Cervicovaginal ancillary only( [facility]), RPR+HBsAg+HCVAb+..., CBC Screen for STD  Plan Orders Placed This Encounter  Procedures   RPR+HBsAg+HCVAb+...    Order Specific Question:   Release to patient    Answer:   Immediate   CBC   May need abx depending on screening result Report if sx worsen    Scheryl Darter 08/23/2023, 3:24 PM

## 2023-08-24 LAB — CBC
Hematocrit: 41.5 % (ref 34.0–46.6)
Hemoglobin: 14.1 g/dL (ref 11.1–15.9)
MCH: 31.1 pg (ref 26.6–33.0)
MCHC: 34 g/dL (ref 31.5–35.7)
MCV: 92 fL (ref 79–97)
Platelets: 373 10*3/uL (ref 150–450)
RBC: 4.53 x10E6/uL (ref 3.77–5.28)
RDW: 12.2 % (ref 11.7–15.4)
WBC: 9.4 10*3/uL (ref 3.4–10.8)

## 2023-08-24 LAB — RPR+HBSAG+HCVAB+...
HIV Screen 4th Generation wRfx: NONREACTIVE
Hep C Virus Ab: NONREACTIVE
Hepatitis B Surface Ag: NEGATIVE
RPR Ser Ql: NONREACTIVE

## 2023-08-25 LAB — CERVICOVAGINAL ANCILLARY ONLY
Bacterial Vaginitis (gardnerella): POSITIVE — AB
Candida Glabrata: NEGATIVE
Candida Vaginitis: NEGATIVE
Chlamydia: NEGATIVE
Comment: NEGATIVE
Comment: NEGATIVE
Comment: NEGATIVE
Comment: NEGATIVE
Comment: NEGATIVE
Comment: NORMAL
Neisseria Gonorrhea: NEGATIVE
Trichomonas: NEGATIVE

## 2023-08-25 MED ORDER — METRONIDAZOLE 500 MG PO TABS: 500.0000 mg | ORAL_TABLET | Freq: Two times a day (BID) | ORAL | 0 refills | Status: AC

## 2023-09-19 ENCOUNTER — Other Ambulatory Visit: Payer: Self-pay | Admitting: Obstetrics & Gynecology

## 2023-09-19 DIAGNOSIS — N941 Unspecified dyspareunia: Secondary | ICD-10-CM

## 2024-08-31 ENCOUNTER — Ambulatory Visit: Payer: Self-pay | Admitting: Obstetrics & Gynecology

## 2024-10-02 ENCOUNTER — Other Ambulatory Visit (HOSPITAL_COMMUNITY)
Admission: RE | Admit: 2024-10-02 | Discharge: 2024-10-02 | Disposition: A | Discharge status: Home / Self Care | Payer: Self-pay | Source: Ambulatory Visit | Attending: Obstetrics and Gynecology | Admitting: Obstetrics and Gynecology

## 2024-10-02 ENCOUNTER — Encounter: Payer: Self-pay | Admitting: Obstetrics

## 2024-10-02 ENCOUNTER — Ambulatory Visit (INDEPENDENT_AMBULATORY_CARE_PROVIDER_SITE_OTHER): Payer: Self-pay | Admitting: Obstetrics

## 2024-10-02 VITALS — BP 111/66 | HR 79 | Ht 65.0 in | Wt 207.8 lb

## 2024-10-02 DIAGNOSIS — Z3169 Encounter for other general counseling and advice on procreation: Secondary | ICD-10-CM

## 2024-10-02 DIAGNOSIS — Z01419 Encounter for gynecological examination (general) (routine) without abnormal findings: Secondary | ICD-10-CM | POA: Insufficient documentation

## 2024-10-02 DIAGNOSIS — E66811 Obesity, class 1: Secondary | ICD-10-CM

## 2024-10-02 DIAGNOSIS — Z3009 Encounter for other general counseling and advice on contraception: Secondary | ICD-10-CM

## 2024-10-02 DIAGNOSIS — N979 Female infertility, unspecified: Secondary | ICD-10-CM

## 2024-10-02 MED ORDER — PNV-DHA+DOCUSATE 27-1.25-300 MG PO CAPS: 1.0000 | ORAL_CAPSULE | Freq: Every day | ORAL | 4 refills | Status: AC

## 2024-10-02 NOTE — Progress Notes (Signed)
 Pt presents for annual. Pt last pap was 10/29/2023. Pt declines all std testing. Pt wants to talk about getting a tubal reversal and about her last pap that was abnormal, done through [facility] but the labs are not showing up. No other questions or concerns at this time.

## 2024-10-02 NOTE — Progress Notes (Signed)
 "  Subjective:        Rachel Campos is a 29 y.o. female here for a routine exam.  Current complaints: Wants to conceive after tubal sterilization.    Personal health questionnaire:  Is patient Ashkenazi Jewish, have a family history of breast and/or ovarian cancer: no Is there a family history of uterine cancer diagnosed at age < 34, gastrointestinal cancer, urinary tract cancer, family member who is a Personnel Officer syndrome-associated carrier: no Is the patient overweight and hypertensive, family history of diabetes, personal history of gestational diabetes, preeclampsia or PCOS: no Is patient over 6, have PCOS,  family history of premature CHD under age 33, diabetes, smoke, have hypertension or peripheral artery disease:  no At any time, has a partner hit, kicked or otherwise hurt or frightened you?: no Over the past 2 weeks, have you felt down, depressed or hopeless?: no Over the past 2 weeks, have you felt little interest or pleasure in doing things?:no   Gynecologic History Patient's last menstrual period was 09/15/2024 (exact date). Contraception: tubal ligation Last Pap: 2024. Results were: ASCUS with negative HR-HPV Last mammogram: n/a. Results were: n/a  Obstetric History OB History  Gravida Para Term Preterm AB Living  2 2 2   2   SAB IAB Ectopic Multiple Live Births     0 2    # Outcome Date GA Lbr Len/2nd Weight Sex Type Anes PTL Lv  2 Term 05/30/18 [redacted]w[redacted]d 06:04 / 00:12 7 lb 5.5 oz (3.331 kg) M Vag-Spont EPI  LIV  1 Term 05/10/16 [redacted]w[redacted]d 05:58 / 01:11 8 lb 2.5 oz (3.7 kg) M Vag-Spont EPI  LIV    Past Medical History:  Diagnosis Date   Anxiety    Depression    Headache    Thoracic spine pain 06/26/2016    Past Surgical History:  Procedure Laterality Date   NO PAST SURGERIES     TUBAL LIGATION N/A 05/31/2018   Procedure: POST PARTUM TUBAL LIGATION;  Surgeon: Barbra Lang PARAS, DO;  Location: [facility];  Service: Gynecology;  Laterality: N/A;    Current  Medications[1] Allergies[2]  Social History   Tobacco Use   Smoking status: Former   Smokeless tobacco: Never  Substance Use Topics   Alcohol use: Not Currently    Family History  Adopted: Yes  Family history unknown: Yes      Review of Systems  Constitutional: negative for fatigue and weight loss Respiratory: negative for cough and wheezing Cardiovascular: negative for chest pain, fatigue and palpitations Gastrointestinal: negative for abdominal pain and change in bowel habits Musculoskeletal:negative for myalgias Neurological: negative for gait problems and tremors Behavioral/Psych: negative for abusive relationship, depression Endocrine: negative for temperature intolerance    Genitourinary:negative for abnormal menstrual periods, genital lesions, hot flashes, sexual problems and vaginal discharge Integument/breast: negative for breast lump, breast tenderness, nipple discharge and skin lesion(s)    Objective:       BP 111/66   Pulse 79   Ht 5' 5 (1.651 m)   Wt 207 lb 12.8 oz (94.3 kg)   LMP 09/15/2024 (Exact Date)   BMI 34.58 kg/m  General:   Alert and no distress  Skin:   no rash or abnormalities  Lungs:   clear to auscultation bilaterally  Heart:   regular rate and rhythm, S1, S2 normal, no murmur, click, rub or gallop  Breasts:   normal without suspicious masses, skin or nipple changes or axillary nodes  Abdomen:  normal findings: no organomegaly, soft, non-tender  and no hernia  Pelvis:  External genitalia: normal general appearance Urinary system: urethral meatus normal and bladder without fullness, nontender Vaginal: normal without tenderness, induration or masses Cervix: normal appearance Adnexa: normal bimanual exam Uterus: anteverted and non-tender, normal size   Lab Review Urine pregnancy test Labs reviewed yes Radiologic studies reviewed yes  I have spent a total of 20 minutes of face-to-face time, excluding clinical staff time, reviewing notes  and preparing to see patient, ordering tests and/or medications, and counseling the patient.   Assessment:    1. Encounter for gynecological examination with Papanicolaou smear of cervix (Primary) Rx: - Cytology - PAP( [place])  2. Encounter for other general counseling or advice on contraception - has had tubal sterilization.  Now wants to conceive.  3. 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 Encounter for preconception consultation - importance of folic acid intake prior to pregnancy stressed, along with healthy diet and lifestyle Rx: - Prenat-FeFum-DSS-FA-DHA w/o A (PNV-DHA +DOCUSATE) 27-1.25-300 MG CAPS; Take 1 capsule by mouth daily before breakfast.  Dispense: 90 capsule; Refill: 4  5. Obesity (BMI 30.0-34.9) - weight reduction with the aid of dietary changes, exercise and behavioral modification encouraged     Plan:    Education reviewed: calcium supplements, depression evaluation, low fat, low cholesterol diet, safe sex/STD prevention, self breast exams, skin cancer screening, and weight bearing exercise. Follow up in: 1 year.   Meds ordered this encounter  Medications   Prenat-FeFum-DSS-FA-DHA w/o A (PNV-DHA +DOCUSATE) 27-1.25-300 MG CAPS    Sig: Take 1 capsule by mouth daily before breakfast.    Dispense:  90 capsule    Refill:  4   Orders Placed This Encounter  Procedures   Ambulatory referral to Infertility    Referral Priority:   Routine    Referral Type:   Consultation    Referral Reason:   Specialty Services Required    Requested Specialty:   Gynecology    Number of Visits Requested:   1     CARLIN RONAL CENTERS, MD, FACOG Attending Obstetrician & Gynecologist, [facility] for [facility], [facility], [facility] 09/01/2025     [1]  Current Outpatient Medications:    metroNIDAZOLE  (FLAGYL ) 500 MG tablet, Take 1 tablet (500 mg total) by mouth 2 (two)  times daily. (Patient not taking: Reported on 10/02/2024), Disp: 14 tablet, Rfl: 0   nitrofurantoin , macrocrystal-monohydrate, (MACROBID ) 100 MG capsule, Take 1 capsule (100 mg total) by mouth 2 (two) times daily., Disp: 10 capsule, Rfl: 0   Prenat-FeFum-DSS-FA-DHA w/o A (PNV-DHA +DOCUSATE) 27-1.25-300 MG CAPS, Take 1 capsule by mouth daily before breakfast., Disp: 90 capsule, Rfl: 4 [2] No Known Allergies  "

## 2024-10-04 LAB — CYTOLOGY - PAP: Diagnosis: NEGATIVE

## 2024-10-08 ENCOUNTER — Ambulatory Visit: Payer: Self-pay | Admitting: Obstetrics
